# Patient Record
Sex: Female | Born: 1990 | Race: Black or African American | Hispanic: No | Marital: Single | Smoking: Former smoker
Health system: Southern US, Community
[De-identification: ages and names within clinical notes are randomized; demographics above are authoritative.]

## PROBLEM LIST (undated history)

## (undated) ENCOUNTER — Inpatient Hospital Stay (HOSPITAL_COMMUNITY): Payer: Self-pay

## (undated) DIAGNOSIS — R51 Headache: Secondary | ICD-10-CM

## (undated) DIAGNOSIS — D649 Anemia, unspecified: Secondary | ICD-10-CM

## (undated) DIAGNOSIS — B999 Unspecified infectious disease: Secondary | ICD-10-CM

## (undated) DIAGNOSIS — R519 Headache, unspecified: Secondary | ICD-10-CM

## (undated) HISTORY — PX: NO PAST SURGERIES: SHX2092

---

## 2016-01-17 ENCOUNTER — Inpatient Hospital Stay (HOSPITAL_COMMUNITY): Payer: Medicaid Other

## 2016-01-17 ENCOUNTER — Encounter (HOSPITAL_COMMUNITY): Payer: Self-pay | Admitting: *Deleted

## 2016-01-17 ENCOUNTER — Inpatient Hospital Stay (HOSPITAL_COMMUNITY)
Admission: AD | Admit: 2016-01-17 | Discharge: 2016-01-17 | Disposition: A | Discharge status: Home / Self Care | Payer: Medicaid Other | Source: Ambulatory Visit | Attending: Obstetrics and Gynecology | Admitting: Obstetrics and Gynecology

## 2016-01-17 DIAGNOSIS — R109 Unspecified abdominal pain: Secondary | ICD-10-CM | POA: Insufficient documentation

## 2016-01-17 DIAGNOSIS — Z3A01 Less than 8 weeks gestation of pregnancy: Secondary | ICD-10-CM | POA: Diagnosis not present

## 2016-01-17 DIAGNOSIS — O9989 Other specified diseases and conditions complicating pregnancy, childbirth and the puerperium: Secondary | ICD-10-CM

## 2016-01-17 DIAGNOSIS — O26899 Other specified pregnancy related conditions, unspecified trimester: Secondary | ICD-10-CM

## 2016-01-17 DIAGNOSIS — O26891 Other specified pregnancy related conditions, first trimester: Secondary | ICD-10-CM | POA: Diagnosis not present

## 2016-01-17 DIAGNOSIS — Z87891 Personal history of nicotine dependence: Secondary | ICD-10-CM | POA: Insufficient documentation

## 2016-01-17 DIAGNOSIS — N898 Other specified noninflammatory disorders of vagina: Secondary | ICD-10-CM | POA: Diagnosis present

## 2016-01-17 HISTORY — DX: Headache: R51

## 2016-01-17 HISTORY — DX: Headache, unspecified: R51.9

## 2016-01-17 LAB — CBC
HCT: 35.8 % — ABNORMAL LOW (ref 36.0–46.0)
Hemoglobin: 11.7 g/dL — ABNORMAL LOW (ref 12.0–15.0)
MCH: 25.8 pg — ABNORMAL LOW (ref 26.0–34.0)
MCHC: 32.7 g/dL (ref 30.0–36.0)
MCV: 79 fL (ref 78.0–100.0)
Platelets: 385 10*3/uL (ref 150–400)
RBC: 4.53 MIL/uL (ref 3.87–5.11)
RDW: 17.6 % — ABNORMAL HIGH (ref 11.5–15.5)
WBC: 8.8 10*3/uL (ref 4.0–10.5)

## 2016-01-17 LAB — URINE MICROSCOPIC-ADD ON

## 2016-01-17 LAB — WET PREP, GENITAL
Sperm: NONE SEEN
Trich, Wet Prep: NONE SEEN
Yeast Wet Prep HPF POC: NONE SEEN

## 2016-01-17 LAB — URINALYSIS, ROUTINE W REFLEX MICROSCOPIC
Bilirubin Urine: NEGATIVE
Glucose, UA: NEGATIVE mg/dL
Ketones, ur: NEGATIVE mg/dL
Leukocytes, UA: NEGATIVE
Nitrite: NEGATIVE
Protein, ur: NEGATIVE mg/dL
Specific Gravity, Urine: 1.01 (ref 1.005–1.030)
pH: 6 (ref 5.0–8.0)

## 2016-01-17 LAB — HCG, QUANTITATIVE, PREGNANCY: hCG, Beta Chain, Quant, S: 10200 m[IU]/mL — ABNORMAL HIGH (ref <5)

## 2016-01-17 LAB — ABO/RH: ABO/RH(D): O POS

## 2016-01-17 LAB — POCT PREGNANCY, URINE: Preg Test, Ur: POSITIVE — AB

## 2016-01-17 MED ORDER — METRONIDAZOLE 500 MG PO TABS: 500.0000 mg | ORAL_TABLET | Freq: Two times a day (BID) | ORAL | Status: DC

## 2016-01-17 MED ORDER — PRENATAL VITAMINS 28-0.8 MG PO TABS
1.0000 | ORAL_TABLET | ORAL | Status: DC
Start: 2016-01-17 — End: 2017-03-06

## 2016-01-17 NOTE — MAU Note (Addendum)
C/o vaginal discharge for past month; c/o abdominal cramping for past week and has gotten progressively worse; denies any bleeding; G1;

## 2016-01-17 NOTE — Discharge Instructions (Signed)
Prenatal Care Providers °[facility]    [facility] ° Phone- [phone]     Phone: [phone] °         °[facility]                      [facility] ° @[place]     Phone: [phone] ° Phone: [phone] °        [facility] °[facility]     Phone: [phone] ° Phone: [phone]   °        [facility] °[facility] [place]                hone: [phone] ° Phone: [phone] °        [facility] °Dr. Bernard Marshall      Phone: [phone] ° Phone: [phone] °        [facility] °[facility]                Phone: [phone] ° [facility]’s [facility]  ° Phone:[phone]    [facility] ([place]) °         Phone: [phone] °[facility] °  Phone: [phone] °Safe Medications in Pregnancy  ° °Acne: °Benzoyl Peroxide °Salicylic Acid ° °Backache/Headache: °Tylenol: 2 regular strength every 4 hours OR °             2 Extra strength every 6 hours ° °Colds/Coughs/Allergies: °Benadryl (alcohol free) 25 mg every 6 hours as needed °Breath right strips °Claritin °Cepacol throat lozenges °Chloraseptic throat spray °Cold-Eeze- up to three times per day °Cough drops, alcohol free °Flonase (by prescription only) °Guaifenesin °Mucinex °Robitussin DM (plain only, alcohol free) °Saline nasal spray/drops °Sudafed (pseudoephedrine) & Actifed ** use only after [redacted] weeks gestation and if you do not have high blood pressure °Tylenol °Vicks Vaporub °Zinc lozenges °Zyrtec  ° °Constipation: °Colace °Ducolax suppositories °Fleet enema °Glycerin suppositories °Metamucil °Milk of magnesia °Miralax °Senokot °Smooth move tea ° °Diarrhea: °Kaopectate °Imodium A-D ° °*NO pepto Bismol ° °Hemorrhoids: °Anusol °Anusol HC °Preparation H °Tucks ° °Indigestion: °Tums °Maalox °Mylanta °Zantac  °Pepcid ° °Insomnia: °Benadryl (alcohol free) 25mg every 6 hours as needed °Tylenol  PM °Unisom, no Gelcaps ° °Leg Cramps: °Tums °MagGel ° °Nausea/Vomiting:  °Bonine °Dramamine °Emetrol °Ginger extract °Sea bands °Meclizine  °Nausea medication to take during pregnancy:  °Unisom (doxylamine succinate 25 mg tablets) Take one tablet daily at bedtime. If symptoms are not adequately controlled, the dose can be increased to a maximum recommended dose of two tablets daily (1/2 tablet in the morning, 1/2 tablet mid-afternoon and one at bedtime). °Vitamin B6 100mg tablets. Take one tablet twice a day (up to 200 mg per day). ° °Skin Rashes: °Aveeno products °Benadryl cream or 25mg every 6 hours as needed °Calamine Lotion °1% cortisone cream ° °Yeast infection: °Gyne-lotrimin 7 °Monistat 7 ° ° °**If taking multiple medications, please check labels to avoid duplicating the same active ingredients °**take medication as directed on the label °** Do not exceed 4000 mg of tylenol in 24 hours °**Do not take medications that contain aspirin or ibuprofen ° ° ° ° °First Trimester of Pregnancy °The first trimester of pregnancy is from week 1 until the end of week 12 (months 1 through 3). A week after a sperm fertilizes an egg, the egg will implant on the wall of the uterus. This   embryo will begin to develop into a baby. Genes from you and your partner are forming the baby. The female genes determine whether the baby is a boy or a girl. At 6-8 weeks, the eyes and face are formed, and the heartbeat can be seen on ultrasound. At the end of 12 weeks, all the baby's organs are formed.  °Now that you are pregnant, you will want to do everything you can to have a healthy baby. Two of the most important things are to get good prenatal care and to follow your health care provider's instructions. Prenatal care is all the medical care you receive before the baby's birth. This care will help prevent, find, and treat any problems during the pregnancy and childbirth. °BODY CHANGES °Your body goes through many changes during pregnancy.  The changes vary from woman to woman.  °· You may gain or lose a couple of pounds at first. °· You may feel sick to your stomach (nauseous) and throw up (vomit). If the vomiting is uncontrollable, call your health care provider. °· You may tire easily. °· You may develop headaches that can be relieved by medicines approved by your health care provider. °· You may urinate more often. Painful urination may mean you have a bladder infection. °· You may develop heartburn as a result of your pregnancy. °· You may develop constipation because certain hormones are causing the muscles that push waste through your intestines to slow down. °· You may develop hemorrhoids or swollen, bulging veins (varicose veins). °· Your breasts may begin to grow larger and become tender. Your nipples may stick out more, and the tissue that surrounds them (areola) may become darker. °· Your gums may bleed and may be sensitive to brushing and flossing. °· Dark spots or blotches (chloasma, mask of pregnancy) may develop on your face. This will likely fade after the baby is born. °· Your menstrual periods will stop. °· You may have a loss of appetite. °· You may develop cravings for certain kinds of food. °· You may have changes in your emotions from day to day, such as being excited to be pregnant or being concerned that something may go wrong with the pregnancy and baby. °· You may have more vivid and strange dreams. °· You may have changes in your hair. These can include thickening of your hair, rapid growth, and changes in texture. Some women also have hair loss during or after pregnancy, or hair that feels dry or thin. Your hair will most likely return to normal after your baby is born. °WHAT TO EXPECT AT YOUR PRENATAL VISITS °During a routine prenatal visit: °· You will be weighed to make sure you and the baby are growing normally. °· Your blood pressure will be taken. °· Your abdomen will be measured to track your baby's growth. °· The  fetal heartbeat will be listened to starting around week 10 or 12 of your pregnancy. °· Test results from any previous visits will be discussed. °Your health care provider may ask you: °· How you are feeling. °· If you are feeling the baby move. °· If you have had any abnormal symptoms, such as leaking fluid, bleeding, severe headaches, or abdominal cramping. °· If you are using any tobacco products, including cigarettes, chewing tobacco, and electronic cigarettes. °· If you have any questions. °Other tests that may be performed during your first trimester include: °· Blood tests to find your blood type and to check for the presence of any previous   infections. They will also be used to check for low iron levels (anemia) and Rh antibodies. Later in the pregnancy, blood tests for diabetes will be done along with other tests if problems develop. °· Urine tests to check for infections, diabetes, or protein in the urine. °· An ultrasound to confirm the proper growth and development of the baby. °· An amniocentesis to check for possible genetic problems. °· Fetal screens for spina bifida and Down syndrome. °· You may need other tests to make sure you and the baby are doing well. °· HIV (human immunodeficiency virus) testing. Routine prenatal testing includes screening for HIV, unless you choose not to have this test. °HOME CARE INSTRUCTIONS  °Medicines °· Follow your health care provider's instructions regarding medicine use. Specific medicines may be either safe or unsafe to take during pregnancy. °· Take your prenatal vitamins as directed. °· If you develop constipation, try taking a stool softener if your health care provider approves. °Diet °· Eat regular, well-balanced meals. Choose a variety of foods, such as meat or vegetable-based protein, fish, milk and low-fat dairy products, vegetables, fruits, and whole grain breads and cereals. Your health care provider will help you determine the amount of weight gain that  is right for you. °· Avoid raw meat and uncooked cheese. These carry germs that can cause birth defects in the baby. °· Eating four or five small meals rather than three large meals a day may help relieve nausea and vomiting. If you start to feel nauseous, eating a few soda crackers can be helpful. Drinking liquids between meals instead of during meals also seems to help nausea and vomiting. °· If you develop constipation, eat more high-fiber foods, such as fresh vegetables or fruit and whole grains. Drink enough fluids to keep your urine clear or pale yellow. °Activity and Exercise °· Exercise only as directed by your health care provider. Exercising will help you: °¨ Control your weight. °¨ Stay in shape. °¨ Be prepared for labor and delivery. °· Experiencing pain or cramping in the lower abdomen or low back is a good sign that you should stop exercising. Check with your health care provider before continuing normal exercises. °· Try to avoid standing for long periods of time. Move your legs often if you must stand in one place for a long time. °· Avoid heavy lifting. °· Wear low-heeled shoes, and practice good posture. °· You may continue to have sex unless your health care provider directs you otherwise. °Relief of Pain or Discomfort °· Wear a good support bra for breast tenderness.   °· Take warm sitz baths to soothe any pain or discomfort caused by hemorrhoids. Use hemorrhoid cream if your health care provider approves.   °· Rest with your legs elevated if you have leg cramps or low back pain. °· If you develop varicose veins in your legs, wear support hose. Elevate your feet for 15 minutes, 3-4 times a day. Limit salt in your diet. °Prenatal Care °· Schedule your prenatal visits by the twelfth week of pregnancy. They are usually scheduled monthly at first, then more often in the last 2 months before delivery. °· Write down your questions. Take them to your prenatal visits. °· Keep all your prenatal visits as  directed by your health care provider. °Safety °· Wear your seat belt at all times when driving. °· Make a list of emergency phone numbers, including numbers for family, friends, the hospital, and police and fire departments. °General Tips °· Ask your health care   provider for a referral to a local prenatal education class. Begin classes no later than at the beginning of month 6 of your pregnancy. °· Ask for help if you have counseling or nutritional needs during pregnancy. Your health care provider can offer advice or refer you to specialists for help with various needs. °· Do not use hot tubs, steam rooms, or saunas. °· Do not douche or use tampons or scented sanitary pads. °· Do not cross your legs for long periods of time. °· Avoid cat litter boxes and soil used by cats. These carry germs that can cause birth defects in the baby and possibly loss of the fetus by miscarriage or stillbirth. °· Avoid all smoking, herbs, alcohol, and medicines not prescribed by your health care provider. Chemicals in these affect the formation and growth of the baby. °· Do not use any tobacco products, including cigarettes, chewing tobacco, and electronic cigarettes. If you need help quitting, ask your health care provider. You may receive counseling support and other resources to help you quit. °· Schedule a dentist appointment. At home, brush your teeth with a soft toothbrush and be gentle when you floss. °SEEK MEDICAL CARE IF:  °· You have dizziness. °· You have mild pelvic cramps, pelvic pressure, or nagging pain in the abdominal area. °· You have persistent nausea, vomiting, or diarrhea. °· You have a bad smelling vaginal discharge. °· You have pain with urination. °· You notice increased swelling in your face, hands, legs, or ankles. °SEEK IMMEDIATE MEDICAL CARE IF:  °· You have a fever. °· You are leaking fluid from your vagina. °· You have spotting or bleeding from your vagina. °· You have severe abdominal cramping or  pain. °· You have rapid weight gain or loss. °· You vomit blood or material that looks like coffee grounds. °· You are exposed to German measles and have never had them. °· You are exposed to fifth disease or chickenpox. °· You develop a severe headache. °· You have shortness of breath. °· You have any kind of trauma, such as from a fall or a car accident. °  °This information is not intended to replace advice given to you by your health care provider. Make sure you discuss any questions you have with your health care provider. °  °Document Released: 12/08/2001 Document Revised: 01/04/2015 Document Reviewed: 10/24/2013 °Elsevier Interactive Patient Education ©2016 [employer] ° °

## 2016-01-17 NOTE — MAU Provider Note (Signed)
History     CSN: [id number]  Arrival date and time: 01/17/16 1044   First Provider Initiated Contact with Patient 01/17/16 1121      Chief Complaint  Patient presents with  . Vaginal Discharge  . Abdominal Pain   HPI  Cynthia Hull 25 y.o. G1P0  presents with c/o of lower abdominal cramping, vaginal discharge . Denies vaginal bleeding. No prior ultrasound done.  Past Medical History  Diagnosis Date  . Headache     Past Surgical History  Procedure Laterality Date  . No past surgeries      Family History  Problem Relation Age of Onset  . Hypertension Mother     Social History  Substance Use Topics  . Smoking status: Former Smoker -- 1 years  . Smokeless tobacco: None  . Alcohol Use: No    Allergies: No Known Allergies  Prescriptions prior to admission  Medication Sig Dispense Refill Last Dose  . acetaminophen (TYLENOL) 500 MG tablet Take 1,000 mg by mouth every 6 (six) hours as needed for mild pain or headache.   01/16/2016 at Unknown time  . ibuprofen (ADVIL,MOTRIN) 200 MG tablet Take 200 mg by mouth every 6 (six) hours as needed for headache.   Past Week at Unknown time    Review of Systems  Constitutional: Negative for fever.  Gastrointestinal: Positive for abdominal pain.  Genitourinary:       Vaginal discharge  All other systems reviewed and are negative.  Physical Exam   Blood pressure 122/68, pulse 93, temperature 98.2 F (36.8 C), temperature source Oral, resp. rate 18, height  (1.626 m), weight 65.772 kg (145 lb), last menstrual period 12/11/2015.  Physical Exam  Nursing note and vitals reviewed. Constitutional: She is oriented to person, place, and time. She appears well-developed and well-nourished. No distress.  HENT:  Head: Normocephalic and atraumatic.  Cardiovascular: Normal rate.   Respiratory: Effort normal. No respiratory distress.  GI: Soft. She exhibits no distension and no mass. There is no tenderness. There is no rebound  and no guarding.  Genitourinary: Vagina normal.  Neurological: She is alert and oriented to person, place, and time.  Skin: Skin is warm and dry.  Psychiatric: She has a normal mood and affect. Her behavior is normal. Judgment and thought content normal.   Results for orders placed or performed during the hospital encounter of 01/17/16 (from the past 24 hour(s))  Urinalysis, Routine w reflex microscopic (not at [facility])     Status: Abnormal   Collection Time: 01/17/16 11:05 AM  Result Value Ref Range   Color, Urine YELLOW YELLOW   APPearance CLEAR CLEAR   Specific Gravity, Urine 1.010 1.005 - 1.030   pH 6.0 5.0 - 8.0   Glucose, UA NEGATIVE NEGATIVE mg/dL   Hgb urine dipstick TRACE (A) NEGATIVE   Bilirubin Urine NEGATIVE NEGATIVE   Ketones, ur NEGATIVE NEGATIVE mg/dL   Protein, ur NEGATIVE NEGATIVE mg/dL   Nitrite NEGATIVE NEGATIVE   Leukocytes, UA NEGATIVE NEGATIVE  Urine microscopic-add on     Status: Abnormal   Collection Time: 01/17/16 11:05 AM  Result Value Ref Range   Squamous Epithelial / LPF 0-5 (A) NONE SEEN   WBC, UA 0-5 0 - 5 WBC/hpf   RBC / HPF 0-5 0 - 5 RBC/hpf   Bacteria, UA RARE (A) NONE SEEN  Pregnancy, urine POC     Status: Abnormal   Collection Time: 01/17/16 11:07 AM  Result Value Ref Range   Preg Test, Ur POSITIVE (  A) NEGATIVE  CBC     Status: Abnormal   Collection Time: 01/17/16 11:30 AM  Result Value Ref Range   WBC 8.8 4.0 - 10.5 K/uL   RBC 4.53 3.87 - 5.11 MIL/uL   Hemoglobin 11.7 (L) 12.0 - 15.0 g/dL   HCT 19.1 (L) 47.8 - 29.5 %   MCV 79.0 78.0 - 100.0 fL   MCH 25.8 (L) 26.0 - 34.0 pg   MCHC 32.7 30.0 - 36.0 g/dL   RDW 62.1 (H) 30.8 - 65.7 %   Platelets 385 150 - 400 K/uL  hCG, quantitative, pregnancy     Status: Abnormal   Collection Time: 01/17/16 11:30 AM  Result Value Ref Range   hCG, Beta Chain, Quant, S 10200 (H) <5 mIU/mL  Wet prep, genital     Status: Abnormal   Collection Time: 01/17/16 11:37 AM  Result Value Ref Range   Yeast Wet Prep  HPF POC NONE SEEN NONE SEEN   Trich, Wet Prep NONE SEEN NONE SEEN   Clue Cells Wet Prep HPF POC PRESENT (A) NONE SEEN   WBC, Wet Prep HPF POC MODERATE (A) NONE SEEN   Sperm NONE SEEN     MAU Course  Procedures  MDM Ectopic workup- pending labs; Positive Gestional Sac and Yolk sac identified on Ultrasound. Follow up with routine prenatal care  Assessment and Plan   Abdominal cramping in pregnancy Start prenatal vitamins Start prenatal care discharge Kristiane Morsch Grissett 01/17/2016, 11:56 AM

## 2016-01-23 ENCOUNTER — Inpatient Hospital Stay (HOSPITAL_COMMUNITY)
Admission: AD | Admit: 2016-01-23 | Discharge: 2016-01-23 | Disposition: A | Discharge status: Home / Self Care | Payer: Medicaid Other | Source: Ambulatory Visit | Attending: Obstetrics & Gynecology | Admitting: Obstetrics & Gynecology

## 2016-01-23 DIAGNOSIS — Z87891 Personal history of nicotine dependence: Secondary | ICD-10-CM | POA: Insufficient documentation

## 2016-01-23 DIAGNOSIS — Z3A01 Less than 8 weeks gestation of pregnancy: Secondary | ICD-10-CM | POA: Diagnosis not present

## 2016-01-23 DIAGNOSIS — R109 Unspecified abdominal pain: Secondary | ICD-10-CM | POA: Diagnosis not present

## 2016-01-23 DIAGNOSIS — O9989 Other specified diseases and conditions complicating pregnancy, childbirth and the puerperium: Secondary | ICD-10-CM | POA: Diagnosis not present

## 2016-01-23 DIAGNOSIS — O26899 Other specified pregnancy related conditions, unspecified trimester: Secondary | ICD-10-CM

## 2016-01-23 DIAGNOSIS — O219 Vomiting of pregnancy, unspecified: Secondary | ICD-10-CM

## 2016-01-23 DIAGNOSIS — O210 Mild hyperemesis gravidarum: Secondary | ICD-10-CM | POA: Insufficient documentation

## 2016-01-23 LAB — URINE MICROSCOPIC-ADD ON: RBC / HPF: NONE SEEN RBC/hpf (ref 0–5)

## 2016-01-23 LAB — URINALYSIS, ROUTINE W REFLEX MICROSCOPIC
Bilirubin Urine: NEGATIVE
Glucose, UA: NEGATIVE mg/dL
Ketones, ur: NEGATIVE mg/dL
Leukocytes, UA: NEGATIVE
Nitrite: NEGATIVE
Protein, ur: NEGATIVE mg/dL
Specific Gravity, Urine: 1.025 (ref 1.005–1.030)
pH: 7 (ref 5.0–8.0)

## 2016-01-23 MED ORDER — PROMETHAZINE HCL 25 MG PO TABS
25.0000 mg | ORAL_TABLET | Freq: Once | ORAL | Status: AC
  Administered 2016-01-23: 25 mg via ORAL
  Filled 2016-01-23: qty 1

## 2016-01-23 MED ORDER — PROMETHAZINE HCL 25 MG PO TABS: 12.5000 mg | ORAL_TABLET | Freq: Four times a day (QID) | ORAL | Status: DC | PRN

## 2016-01-23 NOTE — Discharge Instructions (Signed)

## 2016-01-23 NOTE — MAU Provider Note (Signed)
  History     CSN: [id number]  Arrival date and time: 01/23/16 2116   First Provider Initiated Contact with Patient 01/23/16 2246      Chief Complaint  Patient presents with  . Abdominal Cramping   Diarrhea  This is a new problem. The current episode started today. The problem occurs 2 to 4 times per day. The problem has been unchanged. Diarrhea characteristics: loose stools  Associated symptoms include abdominal pain. Pertinent negatives include no chills, fever or vomiting. Nothing aggravates the symptoms. There are no known risk factors. She has tried nothing for the symptoms.  Emesis  This is a new problem. The current episode started in the past 7 days. Episode frequency: No vomiting only nausea  The problem has been unchanged. There has been no fever. Associated symptoms include abdominal pain and diarrhea. Pertinent negatives include no chills or fever. Risk factors: pregnancy  She has tried nothing for the symptoms.     Past Medical History  Diagnosis Date  . Headache     Past Surgical History  Procedure Laterality Date  . No past surgeries      Family History  Problem Relation Age of Onset  . Hypertension Mother     Social History  Substance Use Topics  . Smoking status: Former Smoker -- 1 years  . Smokeless tobacco: Not on file  . Alcohol Use: No    Allergies: No Known Allergies  Prescriptions prior to admission  Medication Sig Dispense Refill Last Dose  . acetaminophen (TYLENOL) 500 MG tablet Take 1,000 mg by mouth every 6 (six) hours as needed for mild pain or headache.   01/22/2016 at Unknown time  . Prenatal Vit-Fe Fumarate-FA (PRENATAL VITAMINS) 28-0.8 MG TABS Take 1 capsule by mouth 1 day or 1 dose. 90 tablet 3 01/23/2016 at Unknown time  . metroNIDAZOLE (FLAGYL) 500 MG tablet Take 1 tablet (500 mg total) by mouth 2 (two) times daily. 14 tablet 0 Has not started    Review of Systems  Constitutional: Negative for fever and chills.  Gastrointestinal:  Positive for nausea, abdominal pain and diarrhea. Negative for vomiting and constipation.  Genitourinary: Negative for dysuria, urgency and frequency.   Physical Exam   Blood pressure 113/52, pulse 86, temperature 97.7 F (36.5 C), temperature source Oral, resp. rate 18, height  (1.549 m), weight 66.792 kg (147 lb 4 oz), last menstrual period 12/11/2015.  Physical Exam  Nursing note and vitals reviewed. Constitutional: She is oriented to person, place, and time. She appears well-developed and well-nourished. No distress.  HENT:  Head: Normocephalic.  Cardiovascular: Normal rate.   Respiratory: Effort normal.  GI: Soft. There is no tenderness. There is no rebound.  Neurological: She is alert and oriented to person, place, and time.  Skin: Skin is warm and dry.  Psychiatric: She has a normal mood and affect.    MAU Course  Procedures  MDM Patient has had phenergan. She reports feeling better.  Assessment and Plan   1. Nausea/vomiting in pregnancy   2. Abdominal pain affecting pregnancy    DC home Comfort measures reviewed  2nd Trimester precautions  RX: phenergan  Return to MAU as needed FU with OB as planned  Follow-up Information    Follow up with [facility].   Why:  As scheduled   Contact information:   [address] [place] [address] [phone]         Tawnya Crook 01/23/2016, 10:54 PM

## 2016-01-23 NOTE — MAU Note (Signed)
Diarrhea all day. Nausea throughout the pregnancy, and is unable to eat much. C/o cramping with diarrhea today as well.

## 2016-01-23 NOTE — MAU Note (Signed)
PT SAYS SHE WAS HERE ON 1-20-      NOW FEELS  LIGHT  CRAMPING.   FEELS  NAUSEA - NO VOMITING       TODAY  HAD    DIARRHEA  -  WATERY  ALL DAY.       PLANS  TO GET [facility]  WITH  ?  - APPOINTMENT  ON 2-16.

## 2016-02-23 ENCOUNTER — Inpatient Hospital Stay (HOSPITAL_COMMUNITY)
Admission: AD | Admit: 2016-02-23 | Discharge: 2016-02-23 | Disposition: A | Discharge status: Home / Self Care | Payer: Medicaid Other | Source: Ambulatory Visit | Attending: Obstetrics & Gynecology | Admitting: Obstetrics & Gynecology

## 2016-02-23 ENCOUNTER — Encounter (HOSPITAL_COMMUNITY): Payer: Self-pay | Admitting: *Deleted

## 2016-02-23 DIAGNOSIS — O26891 Other specified pregnancy related conditions, first trimester: Secondary | ICD-10-CM | POA: Insufficient documentation

## 2016-02-23 DIAGNOSIS — Z3A10 10 weeks gestation of pregnancy: Secondary | ICD-10-CM | POA: Insufficient documentation

## 2016-02-23 DIAGNOSIS — R112 Nausea with vomiting, unspecified: Secondary | ICD-10-CM | POA: Insufficient documentation

## 2016-02-23 DIAGNOSIS — Z79899 Other long term (current) drug therapy: Secondary | ICD-10-CM | POA: Insufficient documentation

## 2016-02-23 DIAGNOSIS — O219 Vomiting of pregnancy, unspecified: Secondary | ICD-10-CM | POA: Diagnosis not present

## 2016-02-23 LAB — URINALYSIS, ROUTINE W REFLEX MICROSCOPIC
Bilirubin Urine: NEGATIVE
Glucose, UA: NEGATIVE mg/dL
Ketones, ur: 40 mg/dL — AB
Nitrite: NEGATIVE
Protein, ur: NEGATIVE mg/dL
Specific Gravity, Urine: 1.025 (ref 1.005–1.030)
pH: 6 (ref 5.0–8.0)

## 2016-02-23 LAB — COMPREHENSIVE METABOLIC PANEL
ALT: 21 U/L (ref 14–54)
AST: 21 U/L (ref 15–41)
Albumin: 3.8 g/dL (ref 3.5–5.0)
Alkaline Phosphatase: 45 U/L (ref 38–126)
Anion gap: 8 (ref 5–15)
BUN: 8 mg/dL (ref 6–20)
CO2: 21 mmol/L — ABNORMAL LOW (ref 22–32)
Calcium: 10.4 mg/dL — ABNORMAL HIGH (ref 8.9–10.3)
Chloride: 102 mmol/L (ref 101–111)
Creatinine, Ser: 0.59 mg/dL (ref 0.44–1.00)
GFR calc Af Amer: >60 mL/min (ref >60)
GFR calc non Af Amer: >60 mL/min (ref >60)
Glucose, Bld: 93 mg/dL (ref 65–99)
Potassium: 3.9 mmol/L (ref 3.5–5.1)
Sodium: 131 mmol/L — ABNORMAL LOW (ref 135–145)
Total Bilirubin: 0.6 mg/dL (ref 0.3–1.2)
Total Protein: 7.3 g/dL (ref 6.5–8.1)

## 2016-02-23 LAB — CBC
HCT: 35.5 % — ABNORMAL LOW (ref 36.0–46.0)
Hemoglobin: 12.4 g/dL (ref 12.0–15.0)
MCH: 27.8 pg (ref 26.0–34.0)
MCHC: 34.9 g/dL (ref 30.0–36.0)
MCV: 79.6 fL (ref 78.0–100.0)
Platelets: 333 10*3/uL (ref 150–400)
RBC: 4.46 MIL/uL (ref 3.87–5.11)
RDW: 19.5 % — ABNORMAL HIGH (ref 11.5–15.5)
WBC: 10.6 10*3/uL — ABNORMAL HIGH (ref 4.0–10.5)

## 2016-02-23 LAB — URINE MICROSCOPIC-ADD ON

## 2016-02-23 MED ORDER — DEXTROSE 5 % IN LACTATED RINGERS IV BOLUS
1000.0000 mL | Freq: Once | INTRAVENOUS | Status: AC
  Administered 2016-02-23: 1000 mL via INTRAVENOUS

## 2016-02-23 MED ORDER — METOCLOPRAMIDE HCL 10 MG PO TABS: 10.0000 mg | ORAL_TABLET | Freq: Four times a day (QID) | ORAL | Status: DC

## 2016-02-23 MED ORDER — METOCLOPRAMIDE HCL 5 MG/ML IJ SOLN
10.0000 mg | Freq: Once | INTRAMUSCULAR | Status: AC
  Administered 2016-02-23: 10 mg via INTRAVENOUS
  Filled 2016-02-23: qty 2

## 2016-02-23 NOTE — MAU Note (Signed)
Patient is [redacted] weeks pregnant and has not been able to tolerate anything PO for 2 days.  Pt has tried taking phenergan, but states it hasn't helped.  She last took it around 0030.  No c/o pain or vaginal bleeding.

## 2016-02-23 NOTE — Discharge Instructions (Signed)
Morning Sickness °Morning sickness is when you feel sick to your stomach (nauseous) during pregnancy. This nauseous feeling may or may not come with vomiting. It often occurs in the morning but can be a problem any time of day. Morning sickness is most common during the first trimester, but it may continue throughout pregnancy. While morning sickness is unpleasant, it is usually harmless unless you develop severe and continual vomiting (hyperemesis gravidarum). This condition requires more intense treatment.  °CAUSES  °The cause of morning sickness is not completely known but seems to be related to normal hormonal changes that occur in pregnancy. °RISK FACTORS °You are at greater risk if you: °· Experienced nausea or vomiting before your pregnancy. °· Had morning sickness during a previous pregnancy. °· Are pregnant with more than one baby, such as twins. °TREATMENT  °Do not use any medicines (prescription, over-the-counter, or herbal) for morning sickness without first talking to your health care provider. Your health care provider may prescribe or recommend: °· Vitamin B6 supplements. °· Anti-nausea medicines. °· The herbal medicine ginger. °HOME CARE INSTRUCTIONS  °· Only take over-the-counter or prescription medicines as directed by your health care provider. °· Taking multivitamins before getting pregnant can prevent or decrease the severity of morning sickness in most women. °· Eat a piece of dry toast or unsalted crackers before getting out of bed in the morning. °· Eat five or six small meals a day. °· Eat dry and bland foods (rice, baked potato). Foods high in carbohydrates are often helpful. °· Do not drink liquids with your meals. Drink liquids between meals. °· Avoid greasy, fatty, and spicy foods. °· Get someone to cook for you if the smell of any food causes nausea and vomiting. °· If you feel nauseous after taking prenatal vitamins, take the vitamins at night or with a snack.  °· Snack on protein  foods (nuts, yogurt, cheese) between meals if you are hungry. °· Eat unsweetened gelatins for desserts. °· Wearing an acupressure wristband (worn for sea sickness) may be helpful. °· Acupuncture may be helpful. °· Do not smoke. °· Get a humidifier to keep the air in your house free of odors. °· Get plenty of fresh air. °SEEK MEDICAL CARE IF:  °· Your home remedies are not working, and you need medicine. °· You feel dizzy or lightheaded. °· You are losing weight. °SEEK IMMEDIATE MEDICAL CARE IF:  °· You have persistent and uncontrolled nausea and vomiting. °· You pass out (faint). °MAKE SURE YOU: °· Understand these instructions. °· Will watch your condition. °· Will get help right away if you are not doing well or get worse. °  °This information is not intended to replace advice given to you by your health care provider. Make sure you discuss any questions you have with your health care provider. °  °Document Released: 02/04/2007 Document Revised: 12/19/2013 Document Reviewed: 05/31/2013 °Elsevier Interactive Patient Education ©2016 [employer] ° ° ° °Eating Plan for Hyperemesis Gravidarum °Severe cases of hyperemesis gravidarum can lead to dehydration and malnutrition. The hyperemesis eating plan is one way to lessen the symptoms of nausea and vomiting. It is often used with prescribed medicines to control your symptoms.  °WHAT CAN I DO TO RELIEVE MY SYMPTOMS? °Listen to your body. Everyone is different and has different preferences. Find what works best for you. Some of the following things may help: °· Eat and drink slowly. °· Eat 5-6 small meals daily instead of 3 large meals.   °· Eat crackers before you   get out of bed in the morning.   °· Starchy foods are usually well tolerated (such as cereal, toast, bread, potatoes, pasta, rice, and pretzels).   °· Ginger may help with nausea. Add ¼ tsp ground ginger to hot tea or choose ginger tea.   °· Try drinking 100% fruit juice or an electrolyte drink. °· Continue  to take your prenatal vitamins as directed by your health care provider. If you are having trouble taking your prenatal vitamins, talk with your health care provider about different options. °· Include at least 1 serving of protein with your meals and snacks (such as meats or poultry, beans, nuts, eggs, or yogurt). Try eating a protein-rich snack before bed (such as cheese and crackers or a half turkey or peanut butter sandwich). °WHAT THINGS SHOULD I AVOID TO REDUCE MY SYMPTOMS? °The following things may help reduce your symptoms: °· Avoid foods with strong smells. Try eating meals in well-ventilated areas that are free of odors. °· Avoid drinking water or other beverages with meals. Try not to drink anything less than 30 minutes before and after meals. °· Avoid drinking more than 1 cup of fluid at a time. °· Avoid fried or high-fat foods, such as butter and cream sauces. °· Avoid spicy foods. °· Avoid skipping meals the best you can. Nausea can be more intense on an empty stomach. If you cannot tolerate food at that time, do not force it. Try sucking on ice chips or other frozen items and make up the calories later. °· Avoid lying down within 2 hours after eating. °  °This information is not intended to replace advice given to you by your health care provider. Make sure you discuss any questions you have with your health care provider. °  °Document Released: 10/11/2007 Document Revised: 12/19/2013 Document Reviewed: 10/18/2013 °Elsevier Interactive Patient Education ©2016 [employer] ° °

## 2016-02-23 NOTE — MAU Provider Note (Signed)
History     CSN: [id number]  Arrival date and time: 02/23/16 0707   First Provider Initiated Contact with Patient 02/23/16 [phone]        Chief Complaint  Patient presents with  . Emesis   HPI  Cynthia Hull is a 25 y.o. G1P0 at [redacted]w[redacted]d who presents for nausea/vomiting. Symptoms began yesterday morning. Has vomited 3 times in the last 24 hours; last vomited at 1 am. Reports constant nausea. States hasn't been able to keep down food or liquids since Friday. Tried to eat chicken noodle soup last night but vomited. Has phenergan at home; last took it at midnight.  Getting PNC at [facility] in [place]. Last seen there on Thursday; was able to hear fetal heartbeat with doppler during that visit & is requesting doppler today to make sure baby is ok.  Denies abdominal pain, diarrhea, constipation, fever, vaginal bleeding, urinary complaints, or heartburn.   OB History    Gravida Para Term Preterm AB TAB SAB Ectopic Multiple Living   1               Past Medical History  Diagnosis Date  . Headache     Past Surgical History  Procedure Laterality Date  . No past surgeries      Family History  Problem Relation Age of Onset  . Hypertension Mother     Social History  Substance Use Topics  . Smoking status: Current Some Day Smoker -- 1 years  . Smokeless tobacco: None  . Alcohol Use: No    Allergies: No Known Allergies  Prescriptions prior to admission  Medication Sig Dispense Refill Last Dose  . acetaminophen (TYLENOL) 500 MG tablet Take 1,000 mg by mouth every 6 (six) hours as needed for mild pain or headache.   Past Week at Unknown time  . Prenatal Vit-Fe Fumarate-FA (PRENATAL VITAMINS) 28-0.8 MG TABS Take 1 capsule by mouth 1 day or 1 dose. 90 tablet 3 02/22/2016 at Unknown time  . promethazine (PHENERGAN) 25 MG tablet Take 0.5-1 tablets (12.5-25 mg total) by mouth every 6 (six) hours as needed. 30 tablet 0 02/23/2016 at Unknown time  . metroNIDAZOLE (FLAGYL) 500 MG  tablet Take 1 tablet (500 mg total) by mouth 2 (two) times daily. (Patient not taking: Reported on 02/23/2016) 14 tablet 0 Has not started    Review of Systems  Constitutional: Negative.   HENT: Negative.   Gastrointestinal: Positive for nausea and vomiting. Negative for heartburn, abdominal pain, diarrhea and constipation.  Genitourinary: Negative.   Neurological: Negative for dizziness.   Physical Exam   Blood pressure 120/66, pulse 85, temperature 97.8 F (36.6 C), temperature source Oral, resp. rate 18, height 5\' 3"  (1.6 m), weight 142 lb (64.411 kg), last menstrual period 12/11/2015, SpO2 100 %.  Physical Exam  Nursing note and vitals reviewed. Constitutional: She is oriented to person, place, and time. She appears well-developed and well-nourished. No distress.  HENT:  Head: Normocephalic and atraumatic.  Eyes: Conjunctivae are normal. Right eye exhibits no discharge. Left eye exhibits no discharge. No scleral icterus.  Neck: Normal range of motion.  Cardiovascular: Normal rate, regular rhythm and normal heart sounds.   No murmur heard. Respiratory: Effort normal and breath sounds normal. No respiratory distress. She has no wheezes.  GI: Soft. Bowel sounds are normal. She exhibits no distension. There is no tenderness.  Neurological: She is alert and oriented to person, place, and time.  Skin: Skin is warm and dry. She is not  diaphoretic.  Psychiatric: She has a normal mood and affect. Her behavior is normal. Judgment and thought content normal.    MAU Course  Procedures  MDM While awaiting u/a, will give IV fluids per patient request D5LR bolus & reglan 10 mg IV CBC & CMP  Care turned over to [facility] CNM     Judeth Horn, NP 02/23/2016 8:12 AM  Assessment and Plan   Results for orders placed or performed during the hospital encounter of 02/23/16 (from the past 24 hour(s))  Urinalysis, Routine w reflex microscopic (not at [facility])     Status: Abnormal    Collection Time: 02/23/16  7:32 AM  Result Value Ref Range   Color, Urine YELLOW YELLOW   APPearance HAZY (A) CLEAR   Specific Gravity, Urine 1.025 1.005 - 1.030   pH 6.0 5.0 - 8.0   Glucose, UA NEGATIVE NEGATIVE mg/dL   Hgb urine dipstick TRACE (A) NEGATIVE   Bilirubin Urine NEGATIVE NEGATIVE   Ketones, ur 40 (A) NEGATIVE mg/dL   Protein, ur NEGATIVE NEGATIVE mg/dL   Nitrite NEGATIVE NEGATIVE   Leukocytes, UA SMALL (A) NEGATIVE  Urine microscopic-add on     Status: Abnormal   Collection Time: 02/23/16  7:32 AM  Result Value Ref Range   Squamous Epithelial / LPF 6-30 (A) NONE SEEN   WBC, UA 0-5 0 - 5 WBC/hpf   RBC / HPF 0-5 0 - 5 RBC/hpf   Bacteria, UA FEW (A) NONE SEEN  CBC     Status: Abnormal   Collection Time: 02/23/16  8:00 AM  Result Value Ref Range   WBC 10.6 (H) 4.0 - 10.5 K/uL   RBC 4.46 3.87 - 5.11 MIL/uL   Hemoglobin 12.4 12.0 - 15.0 g/dL   HCT 16.1 (L) 09.6 - 04.5 %   MCV 79.6 78.0 - 100.0 fL   MCH 27.8 26.0 - 34.0 pg   MCHC 34.9 30.0 - 36.0 g/dL   RDW 40.9 (H) 81.1 - 91.4 %   Platelets 333 150 - 400 K/uL  Comprehensive metabolic panel     Status: Abnormal   Collection Time: 02/23/16  8:00 AM  Result Value Ref Range   Sodium 131 (L) 135 - 145 mmol/L   Potassium 3.9 3.5 - 5.1 mmol/L   Chloride 102 101 - 111 mmol/L   CO2 21 (L) 22 - 32 mmol/L   Glucose, Bld 93 65 - 99 mg/dL   BUN 8 6 - 20 mg/dL   Creatinine, Ser 7.82 0.44 - 1.00 mg/dL   Calcium 95.6 (H) 8.9 - 10.3 mg/dL   Total Protein 7.3 6.5 - 8.1 g/dL   Albumin 3.8 3.5 - 5.0 g/dL   AST 21 15 - 41 U/L   ALT 21 14 - 54 U/L   Alkaline Phosphatase 45 38 - 126 U/L   Total Bilirubin 0.6 0.3 - 1.2 mg/dL   GFR calc non Af Amer >60 >60 mL/min   GFR calc Af Amer >60 >60 mL/min   Anion gap 8 5 - 15    MDM: Reviewed lab results.  Pt is mildly dehydrated on UA today, electrolytes wnl.  D5LR x 1000 ml and Reglan 10 mg IV given with complete resolution of nausea. Pt tolerated PO fluids/food while in MAU.  D/C home  with Rx for Reglan.  Pt stable at time of discharge.  1. Nausea and vomiting during pregnancy prior to [redacted] weeks gestation     D/C home Reglan 10 mg PO Q 6 hours  PRN, discontinue Phenergan or only use at night Resume Diclegis as prescribed F/U with prenatal care provider Return to MAU as needed for emergenies  LEFTWICH-KIRBY, LISA, CNM 9:02 AM

## 2016-03-30 DIAGNOSIS — R87612 Low grade squamous intraepithelial lesion on cytologic smear of cervix (LGSIL): Secondary | ICD-10-CM | POA: Insufficient documentation

## 2016-11-21 ENCOUNTER — Encounter (HOSPITAL_COMMUNITY): Payer: Self-pay

## 2017-03-06 ENCOUNTER — Inpatient Hospital Stay (HOSPITAL_COMMUNITY): Payer: Medicaid Other

## 2017-03-06 ENCOUNTER — Encounter (HOSPITAL_COMMUNITY): Payer: Self-pay | Admitting: Obstetrics and Gynecology

## 2017-03-06 ENCOUNTER — Inpatient Hospital Stay (HOSPITAL_COMMUNITY)
Admission: AD | Admit: 2017-03-06 | Discharge: 2017-03-06 | Disposition: A | Discharge status: Home / Self Care | Payer: Medicaid Other | Source: Ambulatory Visit | Attending: Obstetrics & Gynecology | Admitting: Obstetrics & Gynecology

## 2017-03-06 DIAGNOSIS — F17200 Nicotine dependence, unspecified, uncomplicated: Secondary | ICD-10-CM | POA: Diagnosis not present

## 2017-03-06 DIAGNOSIS — N760 Acute vaginitis: Secondary | ICD-10-CM | POA: Diagnosis not present

## 2017-03-06 DIAGNOSIS — B9689 Other specified bacterial agents as the cause of diseases classified elsewhere: Secondary | ICD-10-CM | POA: Diagnosis not present

## 2017-03-06 DIAGNOSIS — O23591 Infection of other part of genital tract in pregnancy, first trimester: Secondary | ICD-10-CM | POA: Insufficient documentation

## 2017-03-06 DIAGNOSIS — Z3A01 Less than 8 weeks gestation of pregnancy: Secondary | ICD-10-CM | POA: Insufficient documentation

## 2017-03-06 DIAGNOSIS — R109 Unspecified abdominal pain: Secondary | ICD-10-CM | POA: Insufficient documentation

## 2017-03-06 DIAGNOSIS — O26899 Other specified pregnancy related conditions, unspecified trimester: Secondary | ICD-10-CM

## 2017-03-06 DIAGNOSIS — O99332 Smoking (tobacco) complicating pregnancy, second trimester: Secondary | ICD-10-CM | POA: Insufficient documentation

## 2017-03-06 LAB — URINALYSIS, ROUTINE W REFLEX MICROSCOPIC
Bacteria, UA: NONE SEEN
Bilirubin Urine: NEGATIVE
Glucose, UA: NEGATIVE mg/dL
Hgb urine dipstick: NEGATIVE
Ketones, ur: NEGATIVE mg/dL
Nitrite: NEGATIVE
Protein, ur: NEGATIVE mg/dL
Specific Gravity, Urine: 1.019 (ref 1.005–1.030)
pH: 7 (ref 5.0–8.0)

## 2017-03-06 LAB — CBC
HCT: 38.2 % (ref 36.0–46.0)
Hemoglobin: 12.7 g/dL (ref 12.0–15.0)
MCH: 28.7 pg (ref 26.0–34.0)
MCHC: 33.2 g/dL (ref 30.0–36.0)
MCV: 86.2 fL (ref 78.0–100.0)
Platelets: 316 10*3/uL (ref 150–400)
RBC: 4.43 MIL/uL (ref 3.87–5.11)
RDW: 16.6 % — ABNORMAL HIGH (ref 11.5–15.5)
WBC: 8.7 10*3/uL (ref 4.0–10.5)

## 2017-03-06 LAB — WET PREP, GENITAL
Sperm: NONE SEEN
Trich, Wet Prep: NONE SEEN
Yeast Wet Prep HPF POC: NONE SEEN

## 2017-03-06 LAB — HCG, QUANTITATIVE, PREGNANCY: hCG, Beta Chain, Quant, S: 10335 m[IU]/mL — ABNORMAL HIGH (ref <5)

## 2017-03-06 LAB — POCT PREGNANCY, URINE: Preg Test, Ur: POSITIVE — AB

## 2017-03-06 LAB — ABO/RH: ABO/RH(D): O POS

## 2017-03-06 MED ORDER — METRONIDAZOLE 500 MG PO TABS: 500.0000 mg | ORAL_TABLET | Freq: Two times a day (BID) | ORAL | 0 refills | Status: DC

## 2017-03-06 NOTE — MAU Note (Signed)
Pt complains of one episode of vaginal spotting 2 days age, since has noticed a discharge and foul odor.

## 2017-03-06 NOTE — MAU Provider Note (Signed)
History     CSN: [id number]  Arrival date and time: 03/06/17 1446   First Provider Initiated Contact with Patient 03/06/17 1530      Chief Complaint  Patient presents with  . carmping  . Vaginal Discharge   HPI   [name] is a 26 y.o. female G2P1000 here in MAU with abdominal pain. The pain started a few weeks ago. She had some spotting 2 days ago; it was very light so she didn't think anything of it. The spotting has stopped.   She currently rates her pain 7/10. The pain is located in her lower abdomen. The pain comes and goes.   The patient is very nervous about this pregnancy. She delivered a term baby at another hospital in September 2017.  The baby passed about 12 days after delivery.  The baby passed away from anoxic brain injury at birth. Patient says her baby was alive when it was born. It is very difficult for her to talk about.   LMP Jan 29 (certain)  OB History    Gravida Para Term Preterm AB Living   2 1 1  0 0 0   SAB TAB Ectopic Multiple Live Births   0 0 0 0 0      Past Medical History:  Diagnosis Date  . Headache     Past Surgical History:  Procedure Laterality Date  . NO PAST SURGERIES      Family History  Problem Relation Age of Onset  . Hypertension Mother     Social History  Substance Use Topics  . Smoking status: Current Some Day Smoker    Years: 1.00  . Smokeless tobacco: Not on file  . Alcohol use No    Allergies: No Known Allergies  Prescriptions Prior to Admission  Medication Sig Dispense Refill Last Dose  . acetaminophen (TYLENOL) 500 MG tablet Take 500-1,000 mg by mouth every 6 (six) hours as needed for mild pain, moderate pain or headache.    Past Month at Unknown time  . Prenatal Vit-Fe Fumarate-FA (PRENATAL MULTIVITAMIN) TABS tablet Take 1 tablet by mouth daily.   03/06/2017 at Unknown time   Results for orders placed or performed during the hospital encounter of 03/06/17 (from the past 48 hour(s))  Urinalysis,  Routine w reflex microscopic     Status: Abnormal   Collection Time: 03/06/17  3:00 PM  Result Value Ref Range   Color, Urine YELLOW YELLOW   APPearance CLEAR CLEAR   Specific Gravity, Urine 1.019 1.005 - 1.030   pH 7.0 5.0 - 8.0   Glucose, UA NEGATIVE NEGATIVE mg/dL   Hgb urine dipstick NEGATIVE NEGATIVE   Bilirubin Urine NEGATIVE NEGATIVE   Ketones, ur NEGATIVE NEGATIVE mg/dL   Protein, ur NEGATIVE NEGATIVE mg/dL   Nitrite NEGATIVE NEGATIVE   Leukocytes, UA SMALL (A) NEGATIVE   RBC / HPF 0-5 0 - 5 RBC/hpf   WBC, UA 0-5 0 - 5 WBC/hpf   Bacteria, UA NONE SEEN NONE SEEN   Squamous Epithelial / LPF 0-5 (A) NONE SEEN   Mucous PRESENT   Pregnancy, urine POC     Status: Abnormal   Collection Time: 03/06/17  3:06 PM  Result Value Ref Range   Preg Test, Ur POSITIVE (A) NEGATIVE    Comment:        THE SENSITIVITY OF THIS METHODOLOGY IS >24 mIU/mL   Wet prep, genital     Status: Abnormal   Collection Time: 03/06/17  3:54 PM  Result Value  Ref Range   Yeast Wet Prep HPF POC NONE SEEN NONE SEEN   Trich, Wet Prep NONE SEEN NONE SEEN   Clue Cells Wet Prep HPF POC PRESENT (A) NONE SEEN   WBC, Wet Prep HPF POC MODERATE (A) NONE SEEN    Comment: BACTERIA- TOO NUMEROUS TO COUNT   Sperm NONE SEEN   CBC     Status: Abnormal   Collection Time: 03/06/17  3:57 PM  Result Value Ref Range   WBC 8.7 4.0 - 10.5 K/uL   RBC 4.43 3.87 - 5.11 MIL/uL   Hemoglobin 12.7 12.0 - 15.0 g/dL   HCT 25.3 66.4 - 40.3 %   MCV 86.2 78.0 - 100.0 fL   MCH 28.7 26.0 - 34.0 pg   MCHC 33.2 30.0 - 36.0 g/dL   RDW 47.4 (H) 25.9 - 56.3 %   Platelets 316 150 - 400 K/uL  ABO/Rh     Status: None   Collection Time: 03/06/17  4:05 PM  Result Value Ref Range   ABO/RH(D) O POS   hCG, quantitative, pregnancy     Status: Abnormal   Collection Time: 03/06/17  4:05 PM  Result Value Ref Range   hCG, Beta Chain, Quant, S 10,335 (H) <5 mIU/mL    Comment:          GEST. AGE      CONC.  (mIU/mL)   <=1 WEEK        5 - 50      2 WEEKS       50 - 500     3 WEEKS       100 - 10,000     4 WEEKS     1,000 - 30,000     5 WEEKS     3,500 - 115,000   6-8 WEEKS     12,000 - 270,000    12 WEEKS     15,000 - 220,000        FEMALE AND NON-PREGNANT FEMALE:     LESS THAN 5 mIU/mL    US Ob Comp Less 14 Wks  Result Date: 03/06/2017 CLINICAL DATA:  Abdominal pain with positive pregnancy test. EXAM: OBSTETRIC <14 WK [name] AND TRANSVAGINAL OB US TECHNIQUE: Both transabdominal and transvaginal ultrasound examinations were performed for complete evaluation of the gestation as well as the maternal uterus, adnexal regions, and pelvic cul-de-sac. Transvaginal technique was performed to assess early pregnancy. COMPARISON:  None. FINDINGS: Intrauterine gestational sac: Single. Yolk sac:  Visualized. Embryo:  Not visualized. MSD: 10  mm   5 w   5  d Subchorionic hemorrhage:  None visualized. Maternal uterus/adnexae: Maternal ovaries are unremarkable. No evidence for intraperitoneal free fluid. IMPRESSION: Single intrauterine gestational sac with yolk sac visualized but no embryo yet apparent. Estimated gestational age by mean sac diameter is 5 week 5 day. Electronically Signed   By: Kennith Center M.D.   On: 03/06/2017 17:15   US Ob Transvaginal  Result Date: 03/06/2017 CLINICAL DATA:  Abdominal pain with positive pregnancy test. EXAM: OBSTETRIC <14 WK [name] AND TRANSVAGINAL OB US TECHNIQUE: Both transabdominal and transvaginal ultrasound examinations were performed for complete evaluation of the gestation as well as the maternal uterus, adnexal regions, and pelvic cul-de-sac. Transvaginal technique was performed to assess early pregnancy. COMPARISON:  None. FINDINGS: Intrauterine gestational sac: Single. Yolk sac:  Visualized. Embryo:  Not visualized. MSD: 10  mm   5 w   5  d Subchorionic hemorrhage:  None visualized. Maternal uterus/adnexae: Maternal  ovaries are unremarkable. No evidence for intraperitoneal free fluid. IMPRESSION: Single intrauterine  gestational sac with yolk sac visualized but no embryo yet apparent. Estimated gestational age by mean sac diameter is 5 week 5 day. Electronically Signed   By: Kennith Center M.D.   On: 03/06/2017 17:15    Review of Systems  Constitutional: Negative for fever.  Gastrointestinal: Positive for abdominal pain and nausea.   Physical Exam   Blood pressure 127/64, pulse 81, temperature 98.1 F (36.7 C), temperature source Oral, resp. rate 18, height 5\' 4"  (1.626 m), weight 186 lb (84.4 kg), last menstrual period 01/25/2017, SpO2 100 %, unknown if currently breastfeeding.  Physical Exam  Constitutional: She is oriented to person, place, and time. She appears well-developed and well-nourished. No distress.  HENT:  Head: Normocephalic.  Eyes: Pupils are equal, round, and reactive to light.  GI: Soft. Normal appearance. There is tenderness in the suprapubic area. There is no rigidity, no rebound and no guarding.  Genitourinary:  Genitourinary Comments: Vagina - Small amount of white vaginal discharge, + odor  Cervix - No contact bleeding, no active bleeding  Bimanual exam: Cervix closed Uterus non tender, enlarged  Adnexa non tender, no masses bilaterally GC/Chlam, wet prep done Chaperone present for exam.   Musculoskeletal: Normal range of motion.  Neurological: She is alert and oriented to person, place, and time.  Skin: Skin is warm. She is not diaphoretic.  Psychiatric: Her behavior is normal. Her mood appears anxious (Patient tearful ).    MAU Course  Procedures  None  MDM  Wet prep & GC Cbc & ABO [name]   Assessment and Plan   A:  SIUP @ [redacted]w[redacted]d:  1. Abdominal pain in pregnancy, antepartum   2. BV (bacterial vaginosis)     P:  Discharge home in stable condition Return to MAU if symptoms worsen  First trimester warning signs  Message sent to the [facility] for prenatal appointment Support given, patient very appreciative  RX: Flagyl   Duane Lope, NP 03/06/2017 5:45  PM

## 2017-03-06 NOTE — Discharge Instructions (Signed)
Abdominal Pain During Pregnancy °Belly (abdominal) pain is common during pregnancy. Most of the time, it is not a serious problem. Other times, it can be a sign that something is wrong with the pregnancy. Always tell your doctor if you have belly pain. °Follow these instructions at home: °Monitor your belly pain for any changes. The following actions may help you feel better: °· Do not have sex (intercourse) or put anything in your vagina until you feel better. °· Rest until your pain stops. °· Drink clear fluids if you feel sick to your stomach (nauseous). Do not eat solid food until you feel better. °· Only take medicine as told by your doctor. °· Keep all doctor visits as told. °Get help right away if: °· You are bleeding, leaking fluid, or pieces of tissue come out of your vagina. °· You have more pain or cramping. °· You keep throwing up (vomiting). °· You have pain when you pee (urinate) or have blood in your pee. °· You have a fever. °· You do not feel your baby moving as much. °· You feel very weak or feel like passing out. °· You have trouble breathing, with or without belly pain. °· You have a very bad headache and belly pain. °· You have fluid leaking from your vagina and belly pain. °· You keep having watery poop (diarrhea). °· Your belly pain does not go away after resting, or the pain gets worse. °This information is not intended to replace advice given to you by your health care provider. Make sure you discuss any questions you have with your health care provider. °Document Released: 12/02/2009 Document Revised: 07/22/2016 Document Reviewed: 07/13/2013 °Elsevier Interactive Patient Education © 2017 Elsevier Inc. ° °Bacterial Vaginosis °Bacterial vaginosis is an infection of the vagina. It happens when too many germs (bacteria) grow in the vagina. This infection puts you at risk for infections from sex (STIs). Treating this infection can lower your risk for some STIs. You should also treat this if you  are pregnant. It can cause your baby to be born early. °Follow these instructions at home: °Medicines °· Take over-the-counter and prescription medicines only as told by your doctor. °· Take or use your antibiotic medicine as told by your doctor. Do not stop taking or using it even if you start to feel better. °General instructions °· If you your sexual partner is a woman, tell her that you have this infection. She needs to get treatment if she has symptoms. If you have a female partner, he does not need to be treated. °· During treatment: °¨ Avoid sex. °¨ Do not douche. °¨ Avoid alcohol as told. °¨ Avoid breastfeeding as told. °· Drink enough fluid to keep your pee (urine) clear or pale yellow. °· Keep your vagina and butt (rectum) clean. °¨ Wash the area with warm water every day. °¨ Wipe from front to back after you use the toilet. °· Keep all follow-up visits as told by your doctor. This is important. °Preventing this condition °· Do not douche. °· Use only warm water to wash around your vagina. °· Use protection when you have sex. This includes: °¨ Latex condoms. °¨ Dental dams. °· Limit how many people you have sex with. It is best to only have sex with the same person (be monogamous). °· Get tested for STIs. Have your partner get tested. °· Wear underwear that is cotton or lined with cotton. °· Avoid tight pants and pantyhose. This is most important in summer. °·   Do not use any products that have nicotine or tobacco in them. These include cigarettes and e-cigarettes. If you need help quitting, ask your doctor. °· Do not use illegal drugs. °· Limit how much alcohol you drink. °Contact a doctor if: °· Your symptoms do not get better, even after you are treated. °· You have more discharge or pain when you pee (urinate). °· You have a fever. °· You have pain in your belly (abdomen). °· You have pain with sex. °· Your bleed from your vagina between periods. °Summary °· This infection happens when too many germs  (bacteria) grow in the vagina. °· Treating this condition can lower your risk for some infections from sex (STIs). °· You should also treat this if you are pregnant. It can cause early (premature) birth. °· Do not stop taking or using your antibiotic medicine even if you start to feel better. °This information is not intended to replace advice given to you by your health care provider. Make sure you discuss any questions you have with your health care provider. °Document Released: 09/22/2008 Document Revised: 08/29/2016 Document Reviewed: 08/29/2016 °Elsevier Interactive Patient Education © 2017 Elsevier Inc. ° °

## 2017-03-07 LAB — HIV ANTIBODY (ROUTINE TESTING W REFLEX): HIV Screen 4th Generation wRfx: NONREACTIVE

## 2017-03-08 ENCOUNTER — Encounter (HOSPITAL_COMMUNITY): Payer: Self-pay | Admitting: *Deleted

## 2017-03-08 ENCOUNTER — Inpatient Hospital Stay (HOSPITAL_COMMUNITY): Payer: Medicaid Other

## 2017-03-08 ENCOUNTER — Inpatient Hospital Stay (HOSPITAL_COMMUNITY)
Admission: AD | Admit: 2017-03-08 | Discharge: 2017-03-08 | Disposition: A | Discharge status: Home / Self Care | Payer: Medicaid Other | Source: Ambulatory Visit | Attending: Obstetrics and Gynecology | Admitting: Obstetrics and Gynecology

## 2017-03-08 DIAGNOSIS — O468X1 Other antepartum hemorrhage, first trimester: Secondary | ICD-10-CM

## 2017-03-08 DIAGNOSIS — O209 Hemorrhage in early pregnancy, unspecified: Secondary | ICD-10-CM | POA: Insufficient documentation

## 2017-03-08 DIAGNOSIS — N939 Abnormal uterine and vaginal bleeding, unspecified: Secondary | ICD-10-CM

## 2017-03-08 DIAGNOSIS — O418X10 Other specified disorders of amniotic fluid and membranes, first trimester, not applicable or unspecified: Secondary | ICD-10-CM

## 2017-03-08 DIAGNOSIS — O26891 Other specified pregnancy related conditions, first trimester: Secondary | ICD-10-CM | POA: Insufficient documentation

## 2017-03-08 DIAGNOSIS — R109 Unspecified abdominal pain: Secondary | ICD-10-CM | POA: Diagnosis not present

## 2017-03-08 DIAGNOSIS — Z3A01 Less than 8 weeks gestation of pregnancy: Secondary | ICD-10-CM | POA: Insufficient documentation

## 2017-03-08 DIAGNOSIS — O26899 Other specified pregnancy related conditions, unspecified trimester: Secondary | ICD-10-CM

## 2017-03-08 LAB — URINALYSIS, ROUTINE W REFLEX MICROSCOPIC
Bilirubin Urine: NEGATIVE
Glucose, UA: NEGATIVE mg/dL
Ketones, ur: NEGATIVE mg/dL
Nitrite: NEGATIVE
Protein, ur: NEGATIVE mg/dL
Specific Gravity, Urine: 1.016 (ref 1.005–1.030)
pH: 6 (ref 5.0–8.0)

## 2017-03-08 LAB — CBC WITH DIFFERENTIAL/PLATELET
Basophils Absolute: 0 10*3/uL (ref 0.0–0.1)
Basophils Relative: 0 %
Eosinophils Absolute: 0.2 10*3/uL (ref 0.0–0.7)
Eosinophils Relative: 2 %
HCT: 37.8 % (ref 36.0–46.0)
Hemoglobin: 13 g/dL (ref 12.0–15.0)
Lymphocytes Relative: 36 %
Lymphs Abs: 3.3 10*3/uL (ref 0.7–4.0)
MCH: 29.5 pg (ref 26.0–34.0)
MCHC: 34.4 g/dL (ref 30.0–36.0)
MCV: 85.9 fL (ref 78.0–100.0)
Monocytes Absolute: 0.6 10*3/uL (ref 0.1–1.0)
Monocytes Relative: 7 %
Neutro Abs: 5 10*3/uL (ref 1.7–7.7)
Neutrophils Relative %: 55 %
Platelets: 333 10*3/uL (ref 150–400)
RBC: 4.4 MIL/uL (ref 3.87–5.11)
RDW: 16.5 % — ABNORMAL HIGH (ref 11.5–15.5)
WBC: 9.2 10*3/uL (ref 4.0–10.5)

## 2017-03-08 LAB — HCG, QUANTITATIVE, PREGNANCY: hCG, Beta Chain, Quant, S: 17311 m[IU]/mL — ABNORMAL HIGH (ref <5)

## 2017-03-08 LAB — GC/CHLAMYDIA PROBE AMP (~~LOC~~) NOT AT ARMC
Chlamydia: NEGATIVE
Neisseria Gonorrhea: NEGATIVE

## 2017-03-08 NOTE — MAU Provider Note (Signed)
History     CSN: [id number]  Arrival date and time: 03/08/17 1604   None     No chief complaint on file.  HPI Cynthia Hull is 26 y.o. G2P1000 [redacted]w[redacted]d weeks presenting with heavy vaginal bleeding. Hx of full term pregnancy loss in Sept 2017 at [facility].  She was seen 03/06/2017 for abdominal cramping.  On that date--+UPT, BHCG 10,335, Blood type O+, Neg GC/CHL, + clues on wet prep, and U/S that showed [redacted]w[redacted]d single IUP + YS without embryo.   Bleeding she presents with today, began approx 3 hrs ago--"gush" while urinating, felt it and  saw more blood in the toilet.  Tiny amount of blood on pad that was placed on admission. Neg for pain or cramping now, had 1 cramp prior to gush of blood. Last intercourse 2-3 weeks ago.   Past Medical History:  Diagnosis Date  . Headache     Past Surgical History:  Procedure Laterality Date  . NO PAST SURGERIES      Family History  Problem Relation Age of Onset  . Hypertension Mother     Social History  Substance Use Topics  . Smoking status: Current Some Day Smoker    Years: 1.00  . Smokeless tobacco: Not on file  . Alcohol use No    Allergies: No Known Allergies  Prescriptions Prior to Admission  Medication Sig Dispense Refill Last Dose  . acetaminophen (TYLENOL) 500 MG tablet Take 500-1,000 mg by mouth every 6 (six) hours as needed for mild pain, moderate pain or headache.    Past Month at Unknown time  . metroNIDAZOLE (FLAGYL) 500 MG tablet Take 1 tablet (500 mg total) by mouth 2 (two) times daily. 14 tablet 0   . Prenatal Vit-Fe Fumarate-FA (PRENATAL MULTIVITAMIN) TABS tablet Take 1 tablet by mouth daily.   03/06/2017 at Unknown time    Review of Systems  Cardiovascular: Negative for chest pain.  Gastrointestinal: Positive for abdominal pain (1 cramp prior to bleeding.  Neg for cramping/pain since).  Genitourinary: Positive for vaginal bleeding.  Musculoskeletal: Negative for back pain.  Neurological: Negative for dizziness.    Physical Exam   Last menstrual period 01/25/2017, unknown if currently breastfeeding.  Physical Exam  Constitutional: She is oriented to person, place, and time. She appears well-developed and well-nourished. No distress.  HENT:  Head: Normocephalic.  Neck: Normal range of motion.  Cardiovascular: Normal rate.   Respiratory: Effort normal.  GI: Soft. She exhibits no distension and no mass. There is no tenderness. There is no rebound and no guarding.  Genitourinary: There is no rash, tenderness or lesion on the right labia. There is no rash, tenderness or lesion on the left labia. Uterus is enlarged (slightly). Uterus is not tender. Cervix exhibits no motion tenderness, no discharge and no friability. Right adnexum displays no mass, no tenderness and no fullness. Left adnexum displays no mass, no tenderness and no fullness. There is bleeding (small amount of pinkish blood, neg for clots) in the vagina. No tenderness in the vagina.  Neurological: She is alert and oriented to person, place, and time.  Skin: Skin is warm and dry.  Psychiatric: She has a normal mood and affect. Her behavior is normal. Judgment and thought content normal.   Results for orders placed or performed during the hospital encounter of 03/08/17 (from the past 24 hour(s))  CBC with Differential/Platelet     Status: Abnormal   Collection Time: 03/08/17  4:22 PM  Result Value Ref Range  WBC 9.2 4.0 - 10.5 K/uL   RBC 4.40 3.87 - 5.11 MIL/uL   Hemoglobin 13.0 12.0 - 15.0 g/dL   HCT 16.137.8 09.636.0 - 04.546.0 %   MCV 85.9 78.0 - 100.0 fL   MCH 29.5 26.0 - 34.0 pg   MCHC 34.4 30.0 - 36.0 g/dL   RDW 40.916.5 (H) 81.111.5 - 91.415.5 %   Platelets 333 150 - 400 K/uL   Neutrophils Relative % 55 %   Neutro Abs 5.0 1.7 - 7.7 K/uL   Lymphocytes Relative 36 %   Lymphs Abs 3.3 0.7 - 4.0 K/uL   Monocytes Relative 7 %   Monocytes Absolute 0.6 0.1 - 1.0 K/uL   Eosinophils Relative 2 %   Eosinophils Absolute 0.2 0.0 - 0.7 K/uL   Basophils  Relative 0 %   Basophils Absolute 0.0 0.0 - 0.1 K/uL  hCG, quantitative, pregnancy     Status: Abnormal   Collection Time: 03/08/17  4:22 PM  Result Value Ref Range   hCG, Beta Chain, Quant, S 17,311 (H) <5 mIU/mL  Urinalysis, Routine w reflex microscopic     Status: Abnormal   Collection Time: 03/08/17  4:45 PM  Result Value Ref Range   Color, Urine YELLOW YELLOW   APPearance CLEAR CLEAR   Specific Gravity, Urine 1.016 1.005 - 1.030   pH 6.0 5.0 - 8.0   Glucose, UA NEGATIVE NEGATIVE mg/dL   Hgb urine dipstick LARGE (A) NEGATIVE   Bilirubin Urine NEGATIVE NEGATIVE   Ketones, ur NEGATIVE NEGATIVE mg/dL   Protein, ur NEGATIVE NEGATIVE mg/dL   Nitrite NEGATIVE NEGATIVE   Leukocytes, UA TRACE (A) NEGATIVE   RBC / HPF 0-5 0 - 5 RBC/hpf   WBC, UA 0-5 0 - 5 WBC/hpf   Bacteria, UA RARE (A) NONE SEEN   Squamous Epithelial / LPF 0-5 (A) NONE SEEN   BLOOD TYPE-O Positive-from previous record  MAU Course  Procedures  GC/CHL neg on 3/10 exam-did not repeat today.  MDM MSE Labs Exam U/S  17:00 Care transferred to J. Rasch, NP Resumed care of the patient @ 1700; patient currently in US. US reviewed in detail with the patient. Discussed subchorionic hemorrhage.  Beta hcg level has increased appropriately since 3/10.  Assessment and Plan  A:      ICD-9-CM ICD-10-CM   1. Subchorionic hematoma in first trimester, single or unspecified fetus 656.83 O41.8X10 US OB Transvaginal    O46.8X1   2. Vaginal bleeding 623.8 N93.9 US OB Transvaginal     US OB Transvaginal     US OB Transvaginal  3. Abdominal pain affecting pregnancy 646.80 O26.899 US OB Transvaginal   789.00 R10.9     P:   Discharge home in stable condition  Bleeding precautions  Pelvic rest Return to MAU if symptoms worsen US in 10 days for viability.  [facility].    Duane LopeJennifer I Rasch, NP 03/08/2017 6:03 PM    Eve Judie PetitM Key 03/08/2017, 4:09 PM

## 2017-03-08 NOTE — MAU Note (Signed)
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  Recently was dx with BV, has not had the Flagyl filled yet.  She states she fell onto her knees and felt something wet from her vagina approx at 1400.  Pt thought it was the increased discharge from BV.  She also got a sharp cramp in her abd which she thought was from hunger pains.  When pt got home approx at 1600 she noticed she had blood in her panties and then in the toilet.  No clots noticed.

## 2017-03-08 NOTE — Discharge Instructions (Signed)
Subchorionic Hematoma °A subchorionic hematoma is a gathering of blood between the outer wall of the placenta and the inner wall of the womb (uterus). The placenta is the organ that connects the fetus to the wall of the uterus. The placenta performs the feeding, breathing (oxygen to the fetus), and waste removal (excretory work) of the fetus. °Subchorionic hematoma is the most common abnormality found on a result from ultrasonography done during the first trimester or early second trimester of pregnancy. If there has been little or no vaginal bleeding, early small hematomas usually shrink on their own and do not affect your baby or pregnancy. The blood is gradually absorbed over 1-2 weeks. When bleeding starts later in pregnancy or the hematoma is larger or occurs in an older pregnant woman, the outcome may not be as good. Larger hematomas may get bigger, which increases the chances for miscarriage. Subchorionic hematoma also increases the risk of premature detachment of the placenta from the uterus, preterm (premature) labor, and stillbirth. °Follow these instructions at home: °· Stay on bed rest if your health care provider recommends this. Although bed rest will not prevent more bleeding or prevent a miscarriage, your health care provider may recommend bed rest until you are advised otherwise. °· Avoid heavy lifting (more than 10 lb [4.5 kg]), exercise, sexual intercourse, or douching as directed by your health care provider. °· Keep track of the number of pads you use each day and how soaked (saturated) they are. Write down this information. °· Do not use tampons. °· Keep all follow-up appointments as directed by your health care provider. Your health care provider may ask you to have follow-up blood tests or ultrasound tests or both. °Get help right away if: °· You have severe cramps in your stomach, back, abdomen, or pelvis. °· You have a fever. °· You pass large clots or tissue. Save any tissue for your  health care provider to look at. °· Your bleeding increases or you become lightheaded, feel weak, or have fainting episodes. °This information is not intended to replace advice given to you by your health care provider. Make sure you discuss any questions you have with your health care provider. °Document Released: 03/31/2007 Document Revised: 05/21/2016 Document Reviewed: 07/13/2013 °Elsevier Interactive Patient Education © 2017 Elsevier Inc. ° °

## 2017-03-08 NOTE — MAU Note (Signed)
Pt was here two days ago and had bloodwork and ultrasound. Pt states she was at work today and felt a few gushes of blood. Pt states when she got home from work at 4, she noted a lot of vaginal bleeding. Pt denies pain and cramping.

## 2017-03-19 ENCOUNTER — Inpatient Hospital Stay (HOSPITAL_COMMUNITY)
Admission: AD | Admit: 2017-03-19 | Discharge: 2017-03-19 | Disposition: A | Discharge status: Home / Self Care | Payer: Medicaid Other | Source: Ambulatory Visit | Attending: Obstetrics & Gynecology | Admitting: Obstetrics & Gynecology

## 2017-03-19 ENCOUNTER — Ambulatory Visit (HOSPITAL_COMMUNITY)
Admission: RE | Admit: 2017-03-19 | Discharge: 2017-03-19 | Disposition: A | Discharge status: Home / Self Care | Payer: Medicaid Other | Source: Ambulatory Visit | Attending: Obstetrics and Gynecology | Admitting: Obstetrics and Gynecology

## 2017-03-19 DIAGNOSIS — Z3401 Encounter for supervision of normal first pregnancy, first trimester: Secondary | ICD-10-CM | POA: Diagnosis not present

## 2017-03-19 DIAGNOSIS — Z3A01 Less than 8 weeks gestation of pregnancy: Secondary | ICD-10-CM | POA: Insufficient documentation

## 2017-03-19 DIAGNOSIS — R109 Unspecified abdominal pain: Secondary | ICD-10-CM

## 2017-03-19 DIAGNOSIS — O208 Other hemorrhage in early pregnancy: Secondary | ICD-10-CM | POA: Diagnosis not present

## 2017-03-19 DIAGNOSIS — O468X1 Other antepartum hemorrhage, first trimester: Secondary | ICD-10-CM

## 2017-03-19 DIAGNOSIS — O418X10 Other specified disorders of amniotic fluid and membranes, first trimester, not applicable or unspecified: Secondary | ICD-10-CM | POA: Diagnosis not present

## 2017-03-19 DIAGNOSIS — O26899 Other specified pregnancy related conditions, unspecified trimester: Secondary | ICD-10-CM

## 2017-03-19 DIAGNOSIS — N939 Abnormal uterine and vaginal bleeding, unspecified: Secondary | ICD-10-CM

## 2017-03-19 DIAGNOSIS — Z3491 Encounter for supervision of normal pregnancy, unspecified, first trimester: Secondary | ICD-10-CM

## 2017-03-19 NOTE — MAU Provider Note (Signed)
History   [id number]   Chief Complaint  Patient presents with  . Follow-up    HPI Cynthia Hull is a 26 y.o. female  G2P1000 here with for follow-up ultrasound.  Patient had ultrasound on 3/12 that showed IUGS with yolk sac. Outpatient ultrasound for viability ordered but scheduled at a time when [facility] closed. Patient denies abdominal pain or vaginal bleeding.  Patient's last menstrual period was 01/25/2017 (exact date).  OB History  Gravida Para Term Preterm AB Living  2 1 1  0 0 0  SAB TAB Ectopic Multiple Live Births  0 0 0 0 0    # Outcome Date GA Lbr Len/2nd Weight Sex Delivery Anes PTL Lv  2 Current           1 Term               Past Medical History:  Diagnosis Date  . Headache     Family History  Problem Relation Age of Onset  . Hypertension Mother     Social History   Social History  . Marital status: Single    Spouse name: N/A  . Number of children: N/A  . Years of education: N/A   Social History Main Topics  . Smoking status: Former Smoker    Years: 1.00    Types: Cigars    Quit date: 02/24/2017  . Smokeless tobacco: Never Used  . Alcohol use No  . Drug use: Yes    Types: Marijuana  . Sexual activity: Yes    Birth control/ protection: None   Other Topics Concern  . Not on file   Social History Narrative  . No narrative on file    No Known Allergies  No current facility-administered medications on file prior to encounter.    Current Outpatient Prescriptions on File Prior to Encounter  Medication Sig Dispense Refill  . acetaminophen (TYLENOL) 500 MG tablet Take 500-1,000 mg by mouth every 6 (six) hours as needed for mild pain, moderate pain or headache.     . metroNIDAZOLE (FLAGYL) 500 MG tablet Take 1 tablet (500 mg total) by mouth 2 (two) times daily. (Patient not taking: Reported on 03/08/2017) 14 tablet 0  . Prenatal Vit-Fe Fumarate-FA (PRENATAL MULTIVITAMIN) TABS tablet Take 1 tablet by mouth daily.       Physical Exam   There  were no vitals filed for this visit.  Physical Exam  Constitutional: She is oriented to person, place, and time. She appears well-developed and well-nourished. No distress.  HENT:  Head: Normocephalic and atraumatic.  Eyes: Conjunctivae are normal. Right eye exhibits no discharge. Left eye exhibits no discharge. No scleral icterus.  Neck: Normal range of motion.  Respiratory: Effort normal. No respiratory distress.  Musculoskeletal: Normal range of motion.  Neurological: She is alert and oriented to person, place, and time.  Skin: Skin is warm and dry. She is not diaphoretic.  Psychiatric: She has a normal mood and affect. Her behavior is normal. Judgment and thought content normal.    MAU Course  Procedures [place] Ob Transvaginal  Result Date: 03/19/2017 CLINICAL DATA:  Vaginal bleeding, subchorionic hematoma in first trimester pregnancy, followup viability EXAM: TRANSVAGINAL OB ULTRASOUND TECHNIQUE: Transvaginal ultrasound was performed for complete evaluation of the gestation as well as the maternal uterus, adnexal regions, and pelvic cul-de-sac. COMPARISON:  03/08/2017 FINDINGS: Intrauterine gestational sac: Present Yolk sac:  Present Embryo:  Present Cardiac Activity: Present Heart Rate: 143 bpm CRL:   9.0  mm   6 w  6 d                  [name] EDC: 11/06/2017 Subchorionic hemorrhage: Moderate subchorionic hemorrhage again identified Maternal uterus/adnexae: Nonvisualization LEFT ovary. RIGHT ovary normal size morphology 4.7 x 2.7 x 3.9 cm. No free pelvic fluid or adnexal masses. IMPRESSION: Single live intrauterine gestation as above. Again identified moderate subchorionic hemorrhage. Nonvisualization LEFT ovary. Electronically Signed   By: Ulyses Southward M.D.   On: 03/19/2017 15:02    MDM Ultrasound today shows SIUP with cardiac activity. Moderate SCH remains  Assessment and Plan  25 y.o. G2P1000 at [redacted]w[redacted]d IUP  1. Normal IUP (intrauterine pregnancy) on prenatal ultrasound, first trimester   2.  Subchorionic hematoma in first trimester, single or unspecified fetus    P: Discharge home Pelvic rest Start prenatal care Discussed reasons to come to MAU  Judeth Horn, NP 03/19/2017 2:44 PM

## 2017-03-19 NOTE — Discharge Instructions (Signed)
Subchorionic Hematoma °A subchorionic hematoma is a gathering of blood between the outer wall of the placenta and the inner wall of the womb (uterus). The placenta is the organ that connects the fetus to the wall of the uterus. The placenta performs the feeding, breathing (oxygen to the fetus), and waste removal (excretory work) of the fetus. °Subchorionic hematoma is the most common abnormality found on a result from ultrasonography done during the first trimester or early second trimester of pregnancy. If there has been little or no vaginal bleeding, early small hematomas usually shrink on their own and do not affect your baby or pregnancy. The blood is gradually absorbed over 1-2 weeks. When bleeding starts later in pregnancy or the hematoma is larger or occurs in an older pregnant woman, the outcome may not be as good. Larger hematomas may get bigger, which increases the chances for miscarriage. Subchorionic hematoma also increases the risk of premature detachment of the placenta from the uterus, preterm (premature) labor, and stillbirth. °Follow these instructions at home: °· Stay on bed rest if your health care provider recommends this. Although bed rest will not prevent more bleeding or prevent a miscarriage, your health care provider may recommend bed rest until you are advised otherwise. °· Avoid heavy lifting (more than 10 lb [4.5 kg]), exercise, sexual intercourse, or douching as directed by your health care provider. °· Keep track of the number of pads you use each day and how soaked (saturated) they are. Write down this information. °· Do not use tampons. °· Keep all follow-up appointments as directed by your health care provider. Your health care provider may ask you to have follow-up blood tests or ultrasound tests or both. °Get help right away if: °· You have severe cramps in your stomach, back, abdomen, or pelvis. °· You have a fever. °· You pass large clots or tissue. Save any tissue for your  health care provider to look at. °· Your bleeding increases or you become lightheaded, feel weak, or have fainting episodes. °This information is not intended to replace advice given to you by your health care provider. Make sure you discuss any questions you have with your health care provider. °Document Released: 03/31/2007 Document Revised: 05/21/2016 Document Reviewed: 07/13/2013 °Elsevier Interactive Patient Education © 2017 Elsevier Inc. ° °

## 2017-03-23 ENCOUNTER — Telehealth: Payer: Self-pay

## 2017-03-23 NOTE — Telephone Encounter (Signed)
Pt called the front office wanted to know if she could get her results of her US instead coming for a office visit for US results.  Notified [facility] and was advised that pt could receive viable pregnancy results on the phone.  LM on pt VM stating that we are calling with results as she had requested to return call back to the office.

## 2017-04-01 ENCOUNTER — Inpatient Hospital Stay (HOSPITAL_COMMUNITY)
Admission: AD | Admit: 2017-04-01 | Discharge: 2017-04-01 | Discharge status: Against Medical Advice | Payer: Medicaid Other | Source: Ambulatory Visit | Attending: Obstetrics & Gynecology | Admitting: Obstetrics & Gynecology

## 2017-04-01 DIAGNOSIS — Z5321 Procedure and treatment not carried out due to patient leaving prior to being seen by health care provider: Secondary | ICD-10-CM | POA: Insufficient documentation

## 2017-04-01 LAB — URINALYSIS, ROUTINE W REFLEX MICROSCOPIC
Bilirubin Urine: NEGATIVE
Glucose, UA: NEGATIVE mg/dL
Hgb urine dipstick: NEGATIVE
Ketones, ur: 5 mg/dL — AB
Nitrite: NEGATIVE
Protein, ur: NEGATIVE mg/dL
Specific Gravity, Urine: 1.031 — ABNORMAL HIGH (ref 1.005–1.030)
pH: 5 (ref 5.0–8.0)

## 2017-04-01 NOTE — MAU Note (Signed)
Pt called, not in lobby.  Did not tell staff she was leaving.

## 2017-04-01 NOTE — Progress Notes (Signed)
Patient not in lobby when called back to room.  FIrst Call.

## 2017-04-01 NOTE — MAU Note (Signed)
Pt called, not in lobby 

## 2017-04-01 NOTE — MAU Note (Signed)
Pt C/O vomiting for about the last 3 weeks, is unable to drink water as she needs to do at work.  Denies pain or bleeding.

## 2017-04-07 ENCOUNTER — Ambulatory Visit (INDEPENDENT_AMBULATORY_CARE_PROVIDER_SITE_OTHER): Payer: Medicaid Other | Admitting: Clinical

## 2017-04-07 ENCOUNTER — Ambulatory Visit (INDEPENDENT_AMBULATORY_CARE_PROVIDER_SITE_OTHER): Payer: Medicaid Other | Admitting: Obstetrics and Gynecology

## 2017-04-07 ENCOUNTER — Encounter: Payer: Self-pay | Admitting: Obstetrics and Gynecology

## 2017-04-07 DIAGNOSIS — Z3491 Encounter for supervision of normal pregnancy, unspecified, first trimester: Secondary | ICD-10-CM | POA: Diagnosis not present

## 2017-04-07 DIAGNOSIS — O09299 Supervision of pregnancy with other poor reproductive or obstetric history, unspecified trimester: Secondary | ICD-10-CM

## 2017-04-07 DIAGNOSIS — F4321 Adjustment disorder with depressed mood: Secondary | ICD-10-CM

## 2017-04-07 DIAGNOSIS — F4320 Adjustment disorder, unspecified: Secondary | ICD-10-CM

## 2017-04-07 DIAGNOSIS — Z8759 Personal history of other complications of pregnancy, childbirth and the puerperium: Secondary | ICD-10-CM | POA: Insufficient documentation

## 2017-04-07 DIAGNOSIS — O09291 Supervision of pregnancy with other poor reproductive or obstetric history, first trimester: Secondary | ICD-10-CM

## 2017-04-07 DIAGNOSIS — Z3490 Encounter for supervision of normal pregnancy, unspecified, unspecified trimester: Secondary | ICD-10-CM

## 2017-04-07 LAB — POCT URINALYSIS DIP (DEVICE)
Bilirubin Urine: NEGATIVE
Glucose, UA: NEGATIVE mg/dL
Hgb urine dipstick: NEGATIVE
Ketones, ur: NEGATIVE mg/dL
Nitrite: NEGATIVE
Protein, ur: NEGATIVE mg/dL
Specific Gravity, Urine: 1.02 (ref 1.005–1.030)
Urobilinogen, UA: 1 mg/dL (ref 0.0–1.0)
pH: 7 (ref 5.0–8.0)

## 2017-04-07 NOTE — BH Specialist Note (Signed)
Integrated Behavioral Health Initial Visit  MRN: [id number] Name: Cynthia Hull   Session Start time: 11:00 Session End time: 11:30 Total time: 30 minutes  Type of Service: Integrated Behavioral Health- Individual/Family Interpretor:No. Interpretor Name and Language: n/a   Warm Hand Off Completed.       SUBJECTIVE: Cynthia Hull is a 26 y.o. female accompanied by patient. Patient was referred by Venia Carbon, NP for grief. Patient reports the following symptoms/concerns: Pt primary concern today is that current pregnancy is bringing up feelings of grief over fetal loss at 40wks with last pregnancy; says it helps to talk about it to work through feelings for emotional wellness in current pregnancy. Duration of problem: Less than 6 months; Severity of problem: moderate  OBJECTIVE: Mood: Depressed and Affect: Depressed and Tearful Risk of harm to self or others: No plan to harm self or others   LIFE CONTEXT: Family and Social: Lives with fiancee; family and friends all very supportive. Pt went through [facility] support prior to current pregnancy, and found it helpful. School/Work: Working Economist: Sleeping and eating "slowly getting back to normal", and no current substance use Life Changes: Fetal loss at 40wks, almost 6 months prior, back to work fulltime three months ago, current pregnancy  GOALS ADDRESSED: Patient will reduce symptoms of: Grief and increase knowledge and/or ability of: self-management skills and also: Healthy grieving over loss   INTERVENTIONS: Motivational Interviewing, Supportive Counseling and Psychoeducation and/or Health Education  Standardized Assessments completed: none today  ASSESSMENT: Patient currently experiencing Grief. Patient may benefit from psychoeducation and brief therapeutic intervention regarding coping with symptoms of depression related to grief.  PLAN: 1. Follow up with behavioral health clinician on : One month, or  as needed by patient 2. Behavioral recommendations:  -Read educational materials regarding coping with symptoms of depression and anxiety related to grief -Consider using meditation apps during work breaks, as needed 3. Referral(s): Integrated [employer] (In Clinic) 4. "From scale of 1-10, how likely are you to follow plan?": 9  Rae Lips, LCSWA  Depression screen [facility] 2/9 04/07/2017  Decreased Interest 0  Down, Depressed, Hopeless 1  PHQ - 2 Score 1  Altered sleeping 0  Tired, decreased energy 0  Change in appetite 0  Feeling bad or failure about yourself  0  Trouble concentrating 0  Moving slowly or fidgety/restless 0  Suicidal thoughts 0  PHQ-9 Score 1   GAD 7 : Generalized Anxiety Score 04/07/2017  Nervous, Anxious, on Edge 0  Control/stop worrying 0  Worry too much - different things 1  Trouble relaxing 0  Restless 0  Easily annoyed or irritable 1  Afraid - awful might happen 1  Total GAD 7 Score 3

## 2017-04-07 NOTE — Progress Notes (Signed)
Discuss birthing plan

## 2017-04-07 NOTE — Progress Notes (Signed)
   PRENATAL VISIT NOTE  Subjective:  Cynthia Hull is a 26 y.o. G2P1000 at [redacted]w[redacted]d being seen today for ongoing prenatal care.  She is currently monitored for the following issues for this low-risk pregnancy and has Abdominal pain affecting pregnancy; Supervision of low-risk pregnancy; and Pregnancy with history of neonatal death on her problem Hull. History of neonatal death, baby lived 14 days in NICU and passed away. Patient says the baby did not have health problems and died of "lack of oxygen".  Very difficult for patient to discuss. Says she had a "perfect" pregnancy".   Patient reports no complaints.   Movement: Absent. Denies leaking of fluid.   The following portions of the patient's history were reviewed and updated as appropriate: allergies, current medications, past family history, past medical history, past social history, past surgical history and problem Hull. Problem Hull updated.  Objective:   Vitals:   04/07/17 0932  BP: 125/61  Pulse: 80  Weight: 176 lb 1.6 oz (79.9 kg)    Fetal Status: Fetal Heart Rate (bpm): 172   Movement: Absent    BP 125/61   Pulse 80   Wt 176 lb 1.6 oz (79.9 kg)   LMP 01/25/2017 (Exact Date)   BMI 30.23 kg/m  Uterine Size: size equals dates  Pelvic Exam: Deferred    Skin: normal coloration and turgor, no rashes    Neurologic: negative   Extremities: normal strength, tone, and muscle mass   HEENT neck supple with midline trachea and thyroid without masses   Mouth/Teeth mucous membranes moist, pharynx normal without lesions   Neck supple and no masses   Cardiovascular: regular rate and rhythm, no murmurs or gallops   Respiratory:  appears well, vitals normal, no respiratory distress, acyanotic, normal RR, neck free of mass or lymphadenopathy, chest clear, no wheezing, crepitations, rhonchi, normal symmetric air entry   Abdomen: soft, non-tender; bowel sounds normal; no masses,  no organomegaly    Assessment and Plan:  Pregnancy: G2P1000  at [redacted]w[redacted]d  1. History of fetal loss  - Ambulatory referral to [facility]  2. Encounter for supervision of low-risk pregnancy, antepartum  - Prenatal Profile I - [name] MFM Fetal Nuchal Translucency; Future - PAP not done today, will await previous prenatal records from [facility] [place].  - Will discuss labor plan of care once records are received.   Preterm labor symptoms and general obstetric precautions including but not limited to vaginal bleeding, contractions, leaking of fluid and fetal movement were reviewed in detail with the patient. Please refer to After Visit Summary for other counseling recommendations.   Initial labs drawn. Continue prenatal vitamins. Genetic Screening discussed, First trimester screen requested, Nuchal Translucency ordered.  Ultrasound discussed; fetal anatomic survey: requested Problem Hull reviewed and updated. The nature of  - [facility] with multiple MDs and other Advanced Practice Providers was explained to patient; also emphasized that residents, students are part of our team. Routine obstetric precautions reviewed.   Duane Lope, NP

## 2017-04-08 ENCOUNTER — Encounter: Payer: Self-pay | Admitting: Obstetrics and Gynecology

## 2017-04-08 DIAGNOSIS — O09299 Supervision of pregnancy with other poor reproductive or obstetric history, unspecified trimester: Secondary | ICD-10-CM | POA: Insufficient documentation

## 2017-04-08 LAB — PRENATAL PROFILE I(LABCORP)
Antibody Screen: NEGATIVE
Basophils Absolute: 0 10*3/uL (ref 0.0–0.2)
Basos: 0 %
EOS (ABSOLUTE): 0.1 10*3/uL (ref 0.0–0.4)
Eos: 1 %
Hematocrit: 38.1 % (ref 34.0–46.6)
Hemoglobin: 13.3 g/dL (ref 11.1–15.9)
Hepatitis B Surface Ag: NEGATIVE
Immature Grans (Abs): 0 10*3/uL (ref 0.0–0.1)
Immature Granulocytes: 0 %
Lymphocytes Absolute: 2 10*3/uL (ref 0.7–3.1)
Lymphs: 22 %
MCH: 30.9 pg (ref 26.6–33.0)
MCHC: 34.9 g/dL (ref 31.5–35.7)
MCV: 89 fL (ref 79–97)
Monocytes Absolute: 0.8 10*3/uL (ref 0.1–0.9)
Monocytes: 10 %
Neutrophils Absolute: 5.9 10*3/uL (ref 1.4–7.0)
Neutrophils: 67 %
Platelets: 344 10*3/uL (ref 150–379)
RBC: 4.3 x10E6/uL (ref 3.77–5.28)
RDW: 16.5 % — ABNORMAL HIGH (ref 12.3–15.4)
RPR Ser Ql: NONREACTIVE
Rh Factor: POSITIVE
Rubella Antibodies, IGG: 1.59 index (ref >0.99)
WBC: 8.8 10*3/uL (ref 3.4–10.8)

## 2017-04-12 ENCOUNTER — Encounter: Payer: Self-pay | Admitting: Obstetrics and Gynecology

## 2017-04-13 ENCOUNTER — Encounter: Payer: Medicaid Other | Admitting: Obstetrics & Gynecology

## 2017-04-21 ENCOUNTER — Encounter (HOSPITAL_COMMUNITY): Payer: Self-pay

## 2017-04-21 ENCOUNTER — Ambulatory Visit (HOSPITAL_COMMUNITY)
Admission: RE | Admit: 2017-04-21 | Discharge: 2017-04-21 | Disposition: A | Discharge status: Home / Self Care | Payer: Medicaid Other | Source: Ambulatory Visit | Attending: Obstetrics and Gynecology | Admitting: Obstetrics and Gynecology

## 2017-04-21 DIAGNOSIS — Z3490 Encounter for supervision of normal pregnancy, unspecified, unspecified trimester: Secondary | ICD-10-CM

## 2017-04-21 DIAGNOSIS — O09291 Supervision of pregnancy with other poor reproductive or obstetric history, first trimester: Secondary | ICD-10-CM | POA: Diagnosis not present

## 2017-04-21 DIAGNOSIS — Z3682 Encounter for antenatal screening for nuchal translucency: Secondary | ICD-10-CM | POA: Insufficient documentation

## 2017-04-21 DIAGNOSIS — O09891 Supervision of other high risk pregnancies, first trimester: Secondary | ICD-10-CM | POA: Insufficient documentation

## 2017-04-21 DIAGNOSIS — Z3A12 12 weeks gestation of pregnancy: Secondary | ICD-10-CM | POA: Diagnosis not present

## 2017-04-22 ENCOUNTER — Other Ambulatory Visit (HOSPITAL_COMMUNITY): Payer: Self-pay | Admitting: *Deleted

## 2017-04-22 DIAGNOSIS — Z3689 Encounter for other specified antenatal screening: Secondary | ICD-10-CM

## 2017-04-30 ENCOUNTER — Other Ambulatory Visit: Payer: Self-pay

## 2017-05-05 ENCOUNTER — Encounter: Payer: Self-pay | Admitting: General Practice

## 2017-05-05 ENCOUNTER — Telehealth: Payer: Self-pay | Admitting: *Deleted

## 2017-05-05 ENCOUNTER — Ambulatory Visit (INDEPENDENT_AMBULATORY_CARE_PROVIDER_SITE_OTHER): Payer: Medicaid Other | Admitting: Medical

## 2017-05-05 DIAGNOSIS — Z3490 Encounter for supervision of normal pregnancy, unspecified, unspecified trimester: Secondary | ICD-10-CM

## 2017-05-05 DIAGNOSIS — Z3482 Encounter for supervision of other normal pregnancy, second trimester: Secondary | ICD-10-CM

## 2017-05-05 NOTE — Patient Instructions (Signed)
Second Trimester of Pregnancy The second trimester is from week 13 through week 28, month 4 through 6. This is often the time in pregnancy that you feel your best. Often times, morning sickness has lessened or quit. You may have more energy, and you may get hungry more often. Your unborn baby (fetus) is growing rapidly. At the end of the sixth month, he or she is about 9 inches long and weighs about 1 pounds. You will likely feel the baby move (quickening) between 18 and 20 weeks of pregnancy. Follow these instructions at home:  Avoid all smoking, herbs, and alcohol. Avoid drugs not approved by your doctor.  Do not use any tobacco products, including cigarettes, chewing tobacco, and electronic cigarettes. If you need help quitting, ask your doctor. You may get counseling or other support to help you quit.  Only take medicine as told by your doctor. Some medicines are safe and some are not during pregnancy.  Exercise only as told by your doctor. Stop exercising if you start having cramps.  Eat regular, healthy meals.  Wear a good support bra if your breasts are tender.  Do not use hot tubs, steam rooms, or saunas.  Wear your seat belt when driving.  Avoid raw meat, uncooked cheese, and liter boxes and soil used by cats.  Take your prenatal vitamins.  Take 1500-2000 milligrams of calcium daily starting at the 20th week of pregnancy until you deliver your baby.  Try taking medicine that helps you poop (stool softener) as needed, and if your doctor approves. Eat more fiber by eating fresh fruit, vegetables, and whole grains. Drink enough fluids to keep your pee (urine) clear or pale yellow.  Take warm water baths (sitz baths) to soothe pain or discomfort caused by hemorrhoids. Use hemorrhoid cream if your doctor approves.  If you have puffy, bulging veins (varicose veins), wear support hose. Raise (elevate) your feet for 15 minutes, 3-4 times a day. Limit salt in your diet.  Avoid heavy  lifting, wear low heals, and sit up straight.  Rest with your legs raised if you have leg cramps or low back pain.  Visit your dentist if you have not gone during your pregnancy. Use a soft toothbrush to brush your teeth. Be gentle when you floss.  You can have sex (intercourse) unless your doctor tells you not to.  Go to your doctor visits. Get help if:  You feel dizzy.  You have mild cramps or pressure in your lower belly (abdomen).  You have a nagging pain in your belly area.  You continue to feel sick to your stomach (nauseous), throw up (vomit), or have watery poop (diarrhea).  You have bad smelling fluid coming from your vagina.  You have pain with peeing (urination). Get help right away if:  You have a fever.  You are leaking fluid from your vagina.  You have spotting or bleeding from your vagina.  You have severe belly cramping or pain.  You lose or gain weight rapidly.  You have trouble catching your breath and have chest pain.  You notice sudden or extreme puffiness (swelling) of your face, hands, ankles, feet, or legs.  You have not felt the baby move in over an hour.  You have severe headaches that do not go away with medicine.  You have vision changes. This information is not intended to replace advice given to you by your health care provider. Make sure you discuss any questions you have with your health care   provider. Document Released: 03/10/2010 Document Revised: 05/21/2016 Document Reviewed: 02/14/2013 Elsevier Interactive Patient Education  2017 Elsevier Inc.  

## 2017-05-05 NOTE — Progress Notes (Signed)
   PRENATAL VISIT NOTE  Subjective:  Cynthia Hull is a 26 y.o. G2P1000 at 2428w2d being seen today for ongoing prenatal care.  She is currently monitored for the following issues for this low-risk pregnancy and has Abdominal pain affecting pregnancy; Supervision of low-risk pregnancy; and Pregnancy with history of neonatal death on her problem list.  Patient reports no complaints.  Contractions: Not present. Vag. Bleeding: None.   . Denies leaking of fluid.   The following portions of the patient's history were reviewed and updated as appropriate: allergies, current medications, past family history, past medical history, past social history, past surgical history and problem list. Problem list updated.  Objective:   Vitals:   05/05/17 1138  BP: 119/62  Pulse: 75  Weight: 176 lb 12.8 oz (80.2 kg)    Fetal Status: Fetal Heart Rate (bpm): 152         General:  Alert, oriented and cooperative. Patient is in no acute distress.  Skin: Skin is warm and dry. No rash noted.   Cardiovascular: Normal heart rate noted  Respiratory: Normal respiratory effort, no problems with respiration noted  Abdomen: Soft, gravid, appropriate for gestational age. Pain/Pressure: Absent     Pelvic:  Cervical exam deferred        Extremities: Normal range of motion.  Edema: None  Mental Status: Normal mood and affect. Normal behavior. Normal judgment and thought content.   Assessment and Plan:  Pregnancy: G2P1000 at 2728w2d  1. Encounter for supervision of low-risk pregnancy, antepartum - Patient doing well - NT results - normal - reviewed with patient - Patient discussed desire for a primary C/S after traumatic birth of last child resulting in neonatal death. Advised that we would discuss further with MD in third trimester - AFP at next visit to complete genetic screening. Patient advised  - Anatomy US already scheduled for next month  Second trimester precautions and general obstetric precautions including  but not limited to vaginal bleeding, contractions, leaking of fluid and fetal movement were reviewed in detail with the patient. Please refer to After Visit Summary for other counseling recommendations.  Return in about 4 weeks (around 06/02/2017) for LOB.   Marny LowensteinWenzel, Julie N, PA-C

## 2017-05-05 NOTE — Progress Notes (Signed)
1115-Called patient in lobby, no response 1120-Attempted to call in lobby, no response

## 2017-05-05 NOTE — Telephone Encounter (Signed)
Attempted to call Ms Cynthia Hull d/t not being in lobby x2 calls. Patient answered and stated she "went up the road" to get something to eat. Stated the kiosk told her the wait time was 30 min. I let her know that I have been calling for her. Patient coming back to clinic.

## 2017-05-20 ENCOUNTER — Ambulatory Visit: Payer: Medicaid Other | Admitting: *Deleted

## 2017-05-20 DIAGNOSIS — Z3490 Encounter for supervision of normal pregnancy, unspecified, unspecified trimester: Secondary | ICD-10-CM

## 2017-05-20 NOTE — Progress Notes (Signed)
Pt has been concerned that she has not felt fetal movement yet for this pregnancy. She has been told by friends that during the second pregnancy it is common to feel movement by the 14th week. Pt scheduled nurse visit today to hear the heartbeat for reassurance. Pt was advised that she will most likely begin to feel movement within the next 2 weeks and that 14 weeks is too early to feel movement. Per doppler, fetal movement was audible yet difficult to auscultate FHR. Informal bedside US performed - FHR 152 per PW doppler.  Pt felt reassured after seeing baby on US monitor screen and hearing the heartbeat.

## 2017-06-01 ENCOUNTER — Ambulatory Visit (INDEPENDENT_AMBULATORY_CARE_PROVIDER_SITE_OTHER): Payer: Medicaid Other | Admitting: Medical

## 2017-06-01 VITALS — BP 111/61 | HR 82 | Wt 170.4 lb

## 2017-06-01 DIAGNOSIS — Z3492 Encounter for supervision of normal pregnancy, unspecified, second trimester: Secondary | ICD-10-CM

## 2017-06-01 NOTE — Progress Notes (Signed)
   PRENATAL VISIT NOTE  Subjective:  Alfonse AlpersMaiye Nelda SevereWaller is a 26 y.o. G2P1000 at 5430w1d being seen today for ongoing prenatal care.  She is currently monitored for the following issues for this low-risk pregnancy and has Abdominal pain affecting pregnancy; Supervision of low-risk pregnancy; and Pregnancy with history of neonatal death on her problem list.  Patient reports no complaints.  Contractions: Not present. Vag. Bleeding: None.  Movement: Present. Denies leaking of fluid.   The following portions of the patient's history were reviewed and updated as appropriate: allergies, current medications, past family history, past medical history, past social history, past surgical history and problem list. Problem list updated.  Objective:   Vitals:   06/01/17 0843  BP: 111/61  Pulse: 82  Weight: 170 lb 6.4 oz (77.3 kg)    Fetal Status: Fetal Heart Rate (bpm): 148   Movement: Present     General:  Alert, oriented and cooperative. Patient is in no acute distress.  Skin: Skin is warm and dry. No rash noted.   Cardiovascular: Normal heart rate noted  Respiratory: Normal respiratory effort, no problems with respiration noted  Abdomen: Soft, gravid, appropriate for gestational age. Pain/Pressure: Absent     Pelvic:  Cervical exam deferred        Extremities: Normal range of motion.  Edema: None  Mental Status: Normal mood and affect. Normal behavior. Normal judgment and thought content.   Assessment and Plan:  Pregnancy: G2P1000 at 3530w1d  1. Supervision of low-risk pregnancy, second trimester - AFP, Serum, Open Spina Bifida - Anatomy US scheduled  Second trimester warning symptoms and general obstetric precautions including but not limited to vaginal bleeding, contractions, leaking of fluid and fetal movement were reviewed in detail with the patient. Please refer to After Visit Summary for other counseling recommendations.  Return in about 4 weeks (around 06/29/2017) for LOB.   Vonzella NippleJulie Wenzel,  PA-C

## 2017-06-01 NOTE — Patient Instructions (Signed)
Second Trimester of Pregnancy The second trimester is from week 13 through week 28, month 4 through 6. This is often the time in pregnancy that you feel your best. Often times, morning sickness has lessened or quit. You may have more energy, and you may get hungry more often. Your unborn baby (fetus) is growing rapidly. At the end of the sixth month, he or she is about 9 inches long and weighs about 1 pounds. You will likely feel the baby move (quickening) between 18 and 20 weeks of pregnancy. Follow these instructions at home:  Avoid all smoking, herbs, and alcohol. Avoid drugs not approved by your doctor.  Do not use any tobacco products, including cigarettes, chewing tobacco, and electronic cigarettes. If you need help quitting, ask your doctor. You may get counseling or other support to help you quit.  Only take medicine as told by your doctor. Some medicines are safe and some are not during pregnancy.  Exercise only as told by your doctor. Stop exercising if you start having cramps.  Eat regular, healthy meals.  Wear a good support bra if your breasts are tender.  Do not use hot tubs, steam rooms, or saunas.  Wear your seat belt when driving.  Avoid raw meat, uncooked cheese, and liter boxes and soil used by cats.  Take your prenatal vitamins.  Take 1500-2000 milligrams of calcium daily starting at the 20th week of pregnancy until you deliver your baby.  Try taking medicine that helps you poop (stool softener) as needed, and if your doctor approves. Eat more fiber by eating fresh fruit, vegetables, and whole grains. Drink enough fluids to keep your pee (urine) clear or pale yellow.  Take warm water baths (sitz baths) to soothe pain or discomfort caused by hemorrhoids. Use hemorrhoid cream if your doctor approves.  If you have puffy, bulging veins (varicose veins), wear support hose. Raise (elevate) your feet for 15 minutes, 3-4 times a day. Limit salt in your diet.  Avoid heavy  lifting, wear low heals, and sit up straight.  Rest with your legs raised if you have leg cramps or low back pain.  Visit your dentist if you have not gone during your pregnancy. Use a soft toothbrush to brush your teeth. Be gentle when you floss.  You can have sex (intercourse) unless your doctor tells you not to.  Go to your doctor visits. Get help if:  You feel dizzy.  You have mild cramps or pressure in your lower belly (abdomen).  You have a nagging pain in your belly area.  You continue to feel sick to your stomach (nauseous), throw up (vomit), or have watery poop (diarrhea).  You have bad smelling fluid coming from your vagina.  You have pain with peeing (urination). Get help right away if:  You have a fever.  You are leaking fluid from your vagina.  You have spotting or bleeding from your vagina.  You have severe belly cramping or pain.  You lose or gain weight rapidly.  You have trouble catching your breath and have chest pain.  You notice sudden or extreme puffiness (swelling) of your face, hands, ankles, feet, or legs.  You have not felt the baby move in over an hour.  You have severe headaches that do not go away with medicine.  You have vision changes. This information is not intended to replace advice given to you by your health care provider. Make sure you discuss any questions you have with your health care   provider. Document Released: 03/10/2010 Document Revised: 05/21/2016 Document Reviewed: 02/14/2013 Elsevier Interactive Patient Education  2017 Elsevier Inc.  

## 2017-06-01 NOTE — Progress Notes (Signed)
AFP today

## 2017-06-04 LAB — AFP, SERUM, OPEN SPINA BIFIDA
AFP MoM: 1.06
AFP Value: 46.7 ng/mL
Gest. Age on Collection Date: 18.1 weeks
Maternal Age At EDD: 24.8 yr
OSBR Risk 1 IN: 10000
Test Results:: NEGATIVE
Weight: 170 [lb_av]

## 2017-06-08 ENCOUNTER — Ambulatory Visit (HOSPITAL_COMMUNITY)
Admission: RE | Admit: 2017-06-08 | Discharge: 2017-06-08 | Disposition: A | Discharge status: Home / Self Care | Payer: Medicaid Other | Source: Ambulatory Visit | Attending: Obstetrics and Gynecology | Admitting: Obstetrics and Gynecology

## 2017-06-08 ENCOUNTER — Other Ambulatory Visit (HOSPITAL_COMMUNITY): Payer: Self-pay | Admitting: Obstetrics and Gynecology

## 2017-06-08 DIAGNOSIS — Z3A19 19 weeks gestation of pregnancy: Secondary | ICD-10-CM | POA: Diagnosis not present

## 2017-06-08 DIAGNOSIS — Z3689 Encounter for other specified antenatal screening: Secondary | ICD-10-CM

## 2017-06-08 DIAGNOSIS — O09292 Supervision of pregnancy with other poor reproductive or obstetric history, second trimester: Secondary | ICD-10-CM | POA: Insufficient documentation

## 2017-06-08 DIAGNOSIS — O09892 Supervision of other high risk pregnancies, second trimester: Secondary | ICD-10-CM | POA: Insufficient documentation

## 2017-06-16 ENCOUNTER — Encounter: Payer: Self-pay | Admitting: *Deleted

## 2017-07-06 ENCOUNTER — Ambulatory Visit (INDEPENDENT_AMBULATORY_CARE_PROVIDER_SITE_OTHER): Payer: PRIVATE HEALTH INSURANCE | Admitting: Obstetrics and Gynecology

## 2017-07-06 ENCOUNTER — Encounter: Payer: Self-pay | Admitting: Advanced Practice Midwife

## 2017-07-06 VITALS — BP 109/60 | HR 93 | Wt 163.4 lb

## 2017-07-06 DIAGNOSIS — Z3492 Encounter for supervision of normal pregnancy, unspecified, second trimester: Secondary | ICD-10-CM

## 2017-07-06 DIAGNOSIS — O2612 Low weight gain in pregnancy, second trimester: Secondary | ICD-10-CM

## 2017-07-06 DIAGNOSIS — O09292 Supervision of pregnancy with other poor reproductive or obstetric history, second trimester: Secondary | ICD-10-CM

## 2017-07-06 DIAGNOSIS — O09299 Supervision of pregnancy with other poor reproductive or obstetric history, unspecified trimester: Secondary | ICD-10-CM

## 2017-07-06 DIAGNOSIS — O2610 Low weight gain in pregnancy, unspecified trimester: Secondary | ICD-10-CM | POA: Insufficient documentation

## 2017-07-06 MED ORDER — METOCLOPRAMIDE HCL 10 MG PO TABS
10.0000 mg | ORAL_TABLET | Freq: Three times a day (TID) | ORAL | 1 refills | Status: DC
Start: 2017-07-06 — End: 2017-10-24

## 2017-07-06 NOTE — Progress Notes (Signed)
   PRENATAL VISIT NOTE  Subjective:  Cynthia Hull is a 26 y.o. G2P1000 at 3681w1d being seen today for ongoing prenatal care.  She is currently monitored for the following issues for this low-risk pregnancy and has Abdominal pain affecting pregnancy; Supervision of low-risk pregnancy; Pregnancy with history of neonatal death; and Poor weight gain of pregnancy on her problem list.  Patient reports no complaints.  Contractions: Not present. Vag. Bleeding: None.  Movement: Present. Denies leaking of fluid.   The following portions of the patient's history were reviewed and updated as appropriate: allergies, current medications, past family history, past medical history, past social history, past surgical history and problem list. Problem list updated.  Objective:   Vitals:   07/06/17 0856  BP: 109/60  Pulse: 93  Weight: 163 lb 6.4 oz (74.1 kg)    Fetal Status: Fetal Heart Rate (bpm): 154 Fundal Height: 24 cm Movement: Present     General:  Alert, oriented and cooperative. Patient is in no acute distress.  Skin: Skin is warm and dry. No rash noted.   Cardiovascular: Normal heart rate noted  Respiratory: Normal respiratory effort, no problems with respiration noted  Abdomen: Soft, gravid, appropriate for gestational age. Pain/Pressure: Absent     Pelvic:  Cervical exam deferred        Extremities: Normal range of motion.  Edema: None  Mental Status: Normal mood and affect. Normal behavior. Normal judgment and thought content.   Assessment and Plan:  Pregnancy: G2P1000 at 781w1d  1. Low weight gain during pregnancy in second trimester  - US MFM OB FOLLOW UP; Future - Likely secondary to Nausea, Rx for Reglan.  - 13 lbs weight loss. Encouraged Boost shakes, protein shakes, frequent healthy snacks with protein.   2. Encounter for supervision of low-risk pregnancy in second trimester  - Doing well.  - Attempted to obtain previous delivery records again.  - Discussed previous visit labs    3. Pregnancy with history of neonatal death  - Plans for primary Cesarean section. Will need MD visit in the third trimester.   Preterm labor symptoms and general obstetric precautions including but not limited to vaginal bleeding, contractions, leaking of fluid and fetal movement were reviewed in detail with the patient. Please refer to After Visit Summary for other counseling recommendations.  Return in about 4 weeks (around 08/03/2017).   Keyairra Kolinski, Harolyn RutherfordJennifer I, NP

## 2017-07-10 ENCOUNTER — Encounter (HOSPITAL_COMMUNITY): Payer: Self-pay

## 2017-07-10 ENCOUNTER — Inpatient Hospital Stay (HOSPITAL_COMMUNITY)
Admission: AD | Admit: 2017-07-10 | Discharge: 2017-07-10 | Disposition: A | Discharge status: Home / Self Care | Payer: Medicaid Other | Source: Ambulatory Visit | Attending: Obstetrics and Gynecology | Admitting: Obstetrics and Gynecology

## 2017-07-10 DIAGNOSIS — Z87891 Personal history of nicotine dependence: Secondary | ICD-10-CM | POA: Diagnosis not present

## 2017-07-10 DIAGNOSIS — M549 Dorsalgia, unspecified: Secondary | ICD-10-CM | POA: Diagnosis present

## 2017-07-10 DIAGNOSIS — Z3A23 23 weeks gestation of pregnancy: Secondary | ICD-10-CM | POA: Insufficient documentation

## 2017-07-10 DIAGNOSIS — O4702 False labor before 37 completed weeks of gestation, second trimester: Secondary | ICD-10-CM

## 2017-07-10 LAB — WET PREP, GENITAL
Clue Cells Wet Prep HPF POC: NONE SEEN
Sperm: NONE SEEN
Trich, Wet Prep: NONE SEEN
Yeast Wet Prep HPF POC: NONE SEEN

## 2017-07-10 LAB — URINALYSIS, ROUTINE W REFLEX MICROSCOPIC
Bilirubin Urine: NEGATIVE
Glucose, UA: NEGATIVE mg/dL
Hgb urine dipstick: NEGATIVE
Ketones, ur: NEGATIVE mg/dL
Leukocytes, UA: NEGATIVE
Nitrite: NEGATIVE
Protein, ur: NEGATIVE mg/dL
Specific Gravity, Urine: 1.006 (ref 1.005–1.030)
pH: 7 (ref 5.0–8.0)

## 2017-07-10 NOTE — Discharge Instructions (Signed)
Braxton Hicks Contractions °Contractions of the uterus can occur throughout pregnancy, but they are not always a sign that you are in labor. You may have practice contractions called Braxton Hicks contractions. These false labor contractions are sometimes confused with true labor. °What are Braxton Hicks contractions? °Braxton Hicks contractions are tightening movements that occur in the muscles of the uterus before labor. Unlike true labor contractions, these contractions do not result in opening (dilation) and thinning of the cervix. Toward the end of pregnancy (32-34 weeks), Braxton Hicks contractions can happen more often and may become stronger. These contractions are sometimes difficult to tell apart from true labor because they can be very uncomfortable. You should not feel embarrassed if you go to the hospital with false labor. °Sometimes, the only way to tell if you are in true labor is for your health care provider to look for changes in the cervix. The health care provider will do a physical exam and may monitor your contractions. If you are not in true labor, the exam should show that your cervix is not dilating and your water has not broken. °If there are no prenatal problems or other health problems associated with your pregnancy, it is completely safe for you to be sent home with false labor. You may continue to have Braxton Hicks contractions until you go into true labor. °How can I tell the difference between true labor and false labor? °· Differences °? False labor °? Contractions last 30-70 seconds.: Contractions are usually shorter and not as strong as true labor contractions. °? Contractions become very regular.: Contractions are usually irregular. °? Discomfort is usually felt in the top of the uterus, and it spreads to the lower abdomen and low back.: Contractions are often felt in the front of the lower abdomen and in the groin. °? Contractions do not go away with walking.: Contractions may  go away when you walk around or change positions while lying down. °? Contractions usually become more intense and increase in frequency.: Contractions get weaker and are shorter-lasting as time goes on. °? The cervix dilates and gets thinner.: The cervix usually does not dilate or become thin. °Follow these instructions at home: °· Take over-the-counter and prescription medicines only as told by your health care provider. °· Keep up with your usual exercises and follow other instructions from your health care provider. °· Eat and drink lightly if you think you are going into labor. °· If Braxton Hicks contractions are making you uncomfortable: °? Change your position from lying down or resting to walking, or change from walking to resting. °? Sit and rest in a tub of warm water. °? Drink enough fluid to keep your urine clear or pale yellow. Dehydration may cause these contractions. °? Do slow and deep breathing several times an hour. °· Keep all follow-up prenatal visits as told by your health care provider. This is important. °Contact a health care provider if: °· You have a fever. °· You have continuous pain in your abdomen. °Get help right away if: °· Your contractions become stronger, more regular, and closer together. °· You have fluid leaking or gushing from your vagina. °· You pass blood-tinged mucus (bloody show). °· You have bleeding from your vagina. °· You have low back pain that you never had before. °· You feel your baby’s head pushing down and causing pelvic pressure. °· Your baby is not moving inside you as much as it used to. °Summary °· Contractions that occur before labor are   called Braxton Hicks contractions, false labor, or practice contractions. °· Braxton Hicks contractions are usually shorter, weaker, farther apart, and less regular than true labor contractions. True labor contractions usually become progressively stronger and regular and they become more frequent. °· Manage discomfort from  Braxton Hicks contractions by changing position, resting in a warm bath, drinking plenty of water, or practicing deep breathing. °This information is not intended to replace advice given to you by your health care provider. Make sure you discuss any questions you have with your health care provider. °Document Released: 12/14/2005 Document Revised: 11/02/2016 Document Reviewed: 11/02/2016 °Elsevier Interactive Patient Education © 2017 Elsevier Inc. ° °Preterm Labor and Birth Information °Pregnancy normally lasts 39-41 weeks. Preterm labor is when labor starts early. It starts before you have been pregnant for 37 whole weeks. °What are the risk factors for preterm labor? °Preterm labor is more likely to occur in women who: °· Have an infection while pregnant. °· Have a cervix that is short. °· Have gone into preterm labor before. °· Have had surgery on their cervix. °· Are younger than age 17. °· Are older than age 35. °· Are African American. °· Are pregnant with two or more babies. °· Take street drugs while pregnant. °· Smoke while pregnant. °· Do not gain enough weight while pregnant. °· Got pregnant right after another pregnancy. ° °What are the symptoms of preterm labor? °Symptoms of preterm labor include: °· Cramps. The cramps may feel like the cramps some women get during their period. The cramps may happen with watery poop (diarrhea). °· Pain in the belly (abdomen). °· Pain in the lower back. °· Regular contractions or tightening. It may feel like your belly is getting tighter. °· Pressure in the lower belly that seems to get stronger. °· More fluid (discharge) leaking from the vagina. The fluid may be watery or bloody. °· Water breaking. ° °Why is it important to notice signs of preterm labor? °Babies who are born early may not be fully developed. They have a higher chance for: °· Long-term heart problems. °· Long-term lung problems. °· Trouble controlling body systems, like breathing. °· Bleeding in the  brain. °· A condition called cerebral palsy. °· Learning difficulties. °· Death. ° °These risks are highest for babies who are born before 34 weeks of pregnancy. °How is preterm labor treated? °Treatment depends on: °· How long you were pregnant. °· Your condition. °· The health of your baby. ° °Treatment may involve: °· Having a stitch (suture) placed in your cervix. When you give birth, your cervix opens so the baby can come out. The stitch keeps the cervix from opening too soon. °· Staying at the hospital. °· Taking or getting medicines, such as: °? Hormone medicines. °? Medicines to stop contractions. °? Medicines to help the baby’s lungs develop. °? Medicines to prevent your baby from having cerebral palsy. ° °What should I do if I am in preterm labor? °If you think you are going into labor too soon, call your doctor right away. °How can I prevent preterm labor? °· Do not use any tobacco products. °? Examples of these are cigarettes, chewing tobacco, and e-cigarettes. °? If you need help quitting, ask your doctor. °· Do not use street drugs. °· Do not use any medicines unless you ask your doctor if they are safe for you. °· Talk with your doctor before taking any herbal supplements. °· Make sure you gain enough weight. °· Watch for infection. If you think you   might have an infection, get it checked right away. °· If you have gone into preterm labor before, tell your doctor. °This information is not intended to replace advice given to you by your health care provider. Make sure you discuss any questions you have with your health care provider. °Document Released: 03/12/2009 Document Revised: 05/26/2016 Document Reviewed: 05/06/2016 °Elsevier Interactive Patient Education © 2018 Elsevier Inc. ° °

## 2017-07-10 NOTE — Progress Notes (Addendum)
G2P1L0 @ 23.[redacted] wksga. Presents to triage via EMS for back pain that started while at work around 3:15.. Denies lof or bleeding. + flutter.   Noted IV on left hand inserted by EMS.   Doppler: 152  Hx of fetal death during labor. State shoulder stuck.  Primary C/S scheduled for current pregnancy.   1652: EFM placed. Provider at bs assessing pt.   1727: Provider at bs for SVE, GC, and wet prep. FFN collected but ordered not to send. SVE closed.   Discharge instructions given with pt understanding. Pt left unit via ambulatory with SO.

## 2017-07-10 NOTE — MAU Provider Note (Signed)
History     CSN: [id number]  Arrival date and time: 07/10/17 1628   First Provider Initiated Contact with Patient 07/10/17 1652     Chief Complaint  Patient presents with  . Back Pain   HPI Cynthia Hull is a 26 y.o. G2P1000 at 6069w5d who presents via EMS for contractions. She states she was at work and felt like she was having contractions every 5-7 minutes that made her double over in pain. She rates the contractions a 7/10 and did not try anything for the pain. She denies any vaginal bleeding, leaking of fluid or abnormal discharge. She reports good fetal movement.   OB History    Gravida Para Term Preterm AB Living   2 1 1  0 0 0   SAB TAB Ectopic Multiple Live Births   0 0 0 0 1      Past Medical History:  Diagnosis Date  . Headache     Past Surgical History:  Procedure Laterality Date  . NO PAST SURGERIES      Family History  Problem Relation Age of Onset  . Hypertension Mother     Social History  Substance Use Topics  . Smoking status: Former Smoker    Years: 1.00    Types: Cigars    Quit date: 02/24/2017  . Smokeless tobacco: Never Used  . Alcohol use No    Allergies: No Known Allergies  Prescriptions Prior to Admission  Medication Sig Dispense Refill Last Dose  . acetaminophen (TYLENOL) 500 MG tablet Take 500-1,000 mg by mouth every 6 (six) hours as needed for mild pain, moderate pain or headache.    Taking  . metoCLOPramide (REGLAN) 10 MG tablet Take 1 tablet (10 mg total) by mouth 3 (three) times daily before meals. 90 tablet 1   . Prenatal Vit-Fe Fumarate-FA (PRENATAL MULTIVITAMIN) TABS tablet Take 1 tablet by mouth daily.   Taking    Review of Systems  Constitutional: Negative.  Negative for chills and fever.  HENT: Negative.   Respiratory: Negative.  Negative for shortness of breath.   Cardiovascular: Negative.  Negative for chest pain.  Gastrointestinal: Positive for abdominal pain. Negative for constipation, diarrhea, nausea and vomiting.   Genitourinary: Negative.  Negative for dysuria, vaginal bleeding and vaginal discharge.  Neurological: Negative.  Negative for dizziness and headaches.  Psychiatric/Behavioral: Negative.    Physical Exam   Height 5' (1.524 m), weight 163 lb (73.9 kg), last menstrual period 01/25/2017, unknown if currently breastfeeding.  Physical Exam  Nursing note and vitals reviewed. Constitutional: She is oriented to person, place, and time. She appears well-developed and well-nourished.  HENT:  Head: Normocephalic and atraumatic.  Eyes: Conjunctivae are normal. No scleral icterus.  Cardiovascular: Normal rate, regular rhythm and normal heart sounds.   Respiratory: Effort normal and breath sounds normal. No respiratory distress.  GI: Soft. She exhibits no distension. There is no tenderness.  Genitourinary: No vaginal discharge found.  Neurological: She is alert and oriented to person, place, and time.  Skin: Skin is warm and dry.  Psychiatric: She has a normal mood and affect. Her behavior is normal. Judgment and thought content normal.   Dilation: Closed Effacement (%): Thick Cervical Position: Posterior Station: Ballotable Exam by:: Ma Hillock. Neill SNM  Fetal Tracing:  Baseline: 135 Variability: moderate Accelerations: 10x10 Decelerations: none  Toco: uterine irritability   MAU Course  Procedures Results for orders placed or performed during the hospital encounter of 07/10/17 (from the past 24 hour(s))  Urinalysis, Routine w reflex  microscopic     Status: Abnormal   Collection Time: 07/10/17  4:34 PM  Result Value Ref Range   Color, Urine YELLOW YELLOW   APPearance HAZY (A) CLEAR   Specific Gravity, Urine 1.006 1.005 - 1.030   pH 7.0 5.0 - 8.0   Glucose, UA NEGATIVE NEGATIVE mg/dL   Hgb urine dipstick NEGATIVE NEGATIVE   Bilirubin Urine NEGATIVE NEGATIVE   Ketones, ur NEGATIVE NEGATIVE mg/dL   Protein, ur NEGATIVE NEGATIVE mg/dL   Nitrite NEGATIVE NEGATIVE   Leukocytes, UA  NEGATIVE NEGATIVE  Wet prep, genital     Status: Abnormal   Collection Time: 07/10/17  5:30 PM  Result Value Ref Range   Yeast Wet Prep HPF POC NONE SEEN NONE SEEN   Trich, Wet Prep NONE SEEN NONE SEEN   Clue Cells Wet Prep HPF POC NONE SEEN NONE SEEN   WBC, Wet Prep HPF POC FEW (A) NONE SEEN   Sperm NONE SEEN     MDM UA FHR reassuring for gestational age Patient reports resolution of pain upon arrival Assessment and Plan   1. Preterm uterine contractions in second trimester, antepartum    -Discharge patient home in stable contractions -Preterm labor precautions reviewed -Follow up at [facility] as scheduled for prenatal care -Encouraged to return here or to other Urgent Care/ED if she develops worsening of symptoms, increase in pain, fever, or other concerning symptoms.   Cleone Slim SNM 07/10/2017, 5:55 PM

## 2017-07-12 LAB — GC/CHLAMYDIA PROBE AMP (~~LOC~~) NOT AT ARMC
Chlamydia: NEGATIVE
Neisseria Gonorrhea: NEGATIVE

## 2017-07-20 ENCOUNTER — Other Ambulatory Visit: Payer: Self-pay | Admitting: Obstetrics and Gynecology

## 2017-07-20 ENCOUNTER — Ambulatory Visit (HOSPITAL_COMMUNITY)
Admission: RE | Admit: 2017-07-20 | Discharge: 2017-07-20 | Disposition: A | Discharge status: Home / Self Care | Payer: Medicaid Other | Source: Ambulatory Visit | Attending: Obstetrics and Gynecology | Admitting: Obstetrics and Gynecology

## 2017-07-20 DIAGNOSIS — Z362 Encounter for other antenatal screening follow-up: Secondary | ICD-10-CM | POA: Diagnosis not present

## 2017-07-20 DIAGNOSIS — O09892 Supervision of other high risk pregnancies, second trimester: Secondary | ICD-10-CM

## 2017-07-20 DIAGNOSIS — Z3A25 25 weeks gestation of pregnancy: Secondary | ICD-10-CM

## 2017-07-20 DIAGNOSIS — O09292 Supervision of pregnancy with other poor reproductive or obstetric history, second trimester: Secondary | ICD-10-CM | POA: Insufficient documentation

## 2017-07-20 DIAGNOSIS — O2612 Low weight gain in pregnancy, second trimester: Secondary | ICD-10-CM

## 2017-07-20 DIAGNOSIS — O09299 Supervision of pregnancy with other poor reproductive or obstetric history, unspecified trimester: Secondary | ICD-10-CM

## 2017-07-27 ENCOUNTER — Encounter: Payer: Self-pay | Admitting: General Practice

## 2017-08-04 ENCOUNTER — Encounter: Payer: Self-pay | Admitting: Obstetrics and Gynecology

## 2017-08-23 ENCOUNTER — Encounter: Payer: Self-pay | Admitting: General Practice

## 2017-08-23 ENCOUNTER — Ambulatory Visit (INDEPENDENT_AMBULATORY_CARE_PROVIDER_SITE_OTHER): Payer: PRIVATE HEALTH INSURANCE | Admitting: Medical

## 2017-08-23 DIAGNOSIS — Z3492 Encounter for supervision of normal pregnancy, unspecified, second trimester: Secondary | ICD-10-CM

## 2017-08-23 NOTE — Progress Notes (Signed)
Patient here today for Routine OB visit [redacted]w[redacted]d. Patient refused to let me weigh her or do her blood pressure, she is very upset that she had to wait .

## 2017-08-23 NOTE — Patient Instructions (Signed)
Fetal Movement Counts °Patient Name: ________________________________________________ Patient Due Date: ____________________ °What is a fetal movement count? °A fetal movement count is the number of times that you feel your baby move during a certain amount of time. This may also be called a fetal kick count. A fetal movement count is recommended for every pregnant woman. You may be asked to start counting fetal movements as early as week 28 of your pregnancy. °Pay attention to when your baby is most active. You may notice your baby's sleep and wake cycles. You may also notice things that make your baby move more. You should do a fetal movement count: °· When your baby is normally most active. °· At the same time each day. ° °A good time to count movements is while you are resting, after having something to eat and drink. °How do I count fetal movements? °1. Find a quiet, comfortable area. Sit, or lie down on your side. °2. Write down the date, the start time and stop time, and the number of movements that you felt between those two times. Take this information with you to your health care visits. °3. For 2 hours, count kicks, flutters, swishes, rolls, and jabs. You should feel at least 10 movements during 2 hours. °4. You may stop counting after you have felt 10 movements. °5. If you do not feel 10 movements in 2 hours, have something to eat and drink. Then, keep resting and counting for 1 hour. If you feel at least 4 movements during that hour, you may stop counting. °Contact a health care provider if: °· You feel fewer than 4 movements in 2 hours. °· Your baby is not moving like he or she usually does. °Date: ____________ Start time: ____________ Stop time: ____________ Movements: ____________ °Date: ____________ Start time: ____________ Stop time: ____________ Movements: ____________ °Date: ____________ Start time: ____________ Stop time: ____________ Movements: ____________ °Date: ____________ Start time:  ____________ Stop time: ____________ Movements: ____________ °Date: ____________ Start time: ____________ Stop time: ____________ Movements: ____________ °Date: ____________ Start time: ____________ Stop time: ____________ Movements: ____________ °Date: ____________ Start time: ____________ Stop time: ____________ Movements: ____________ °Date: ____________ Start time: ____________ Stop time: ____________ Movements: ____________ °Date: ____________ Start time: ____________ Stop time: ____________ Movements: ____________ °This information is not intended to replace advice given to you by your health care provider. Make sure you discuss any questions you have with your health care provider. °Document Released: 01/13/2007 Document Revised: 08/12/2016 Document Reviewed: 01/23/2016 °Elsevier Interactive Patient Education © 2018 Elsevier Inc. °Braxton Hicks Contractions °Contractions of the uterus can occur throughout pregnancy, but they are not always a sign that you are in labor. You may have practice contractions called Braxton Hicks contractions. These false labor contractions are sometimes confused with true labor. °What are Braxton Hicks contractions? °Braxton Hicks contractions are tightening movements that occur in the muscles of the uterus before labor. Unlike true labor contractions, these contractions do not result in opening (dilation) and thinning of the cervix. Toward the end of pregnancy (32-34 weeks), Braxton Hicks contractions can happen more often and may become stronger. These contractions are sometimes difficult to tell apart from true labor because they can be very uncomfortable. You should not feel embarrassed if you go to the hospital with false labor. °Sometimes, the only way to tell if you are in true labor is for your health care provider to look for changes in the cervix. The health care provider will do a physical exam and may monitor your contractions. If   you are not in true labor, the exam  should show that your cervix is not dilating and your water has not broken. °If there are no prenatal problems or other health problems associated with your pregnancy, it is completely safe for you to be sent home with false labor. You may continue to have Braxton Hicks contractions until you go into true labor. °How can I tell the difference between true labor and false labor? °· Differences °? False labor °? Contractions last 30-70 seconds.: Contractions are usually shorter and not as strong as true labor contractions. °? Contractions become very regular.: Contractions are usually irregular. °? Discomfort is usually felt in the top of the uterus, and it spreads to the lower abdomen and low back.: Contractions are often felt in the front of the lower abdomen and in the groin. °? Contractions do not go away with walking.: Contractions may go away when you walk around or change positions while lying down. °? Contractions usually become more intense and increase in frequency.: Contractions get weaker and are shorter-lasting as time goes on. °? The cervix dilates and gets thinner.: The cervix usually does not dilate or become thin. °Follow these instructions at home: °· Take over-the-counter and prescription medicines only as told by your health care provider. °· Keep up with your usual exercises and follow other instructions from your health care provider. °· Eat and drink lightly if you think you are going into labor. °· If Braxton Hicks contractions are making you uncomfortable: °? Change your position from lying down or resting to walking, or change from walking to resting. °? Sit and rest in a tub of warm water. °? Drink enough fluid to keep your urine clear or pale yellow. Dehydration may cause these contractions. °? Do slow and deep breathing several times an hour. °· Keep all follow-up prenatal visits as told by your health care provider. This is important. °Contact a health care provider if: °· You have a  fever. °· You have continuous pain in your abdomen. °Get help right away if: °· Your contractions become stronger, more regular, and closer together. °· You have fluid leaking or gushing from your vagina. °· You pass blood-tinged mucus (bloody show). °· You have bleeding from your vagina. °· You have low back pain that you never had before. °· You feel your baby’s head pushing down and causing pelvic pressure. °· Your baby is not moving inside you as much as it used to. °Summary °· Contractions that occur before labor are called Braxton Hicks contractions, false labor, or practice contractions. °· Braxton Hicks contractions are usually shorter, weaker, farther apart, and less regular than true labor contractions. True labor contractions usually become progressively stronger and regular and they become more frequent. °· Manage discomfort from Braxton Hicks contractions by changing position, resting in a warm bath, drinking plenty of water, or practicing deep breathing. °This information is not intended to replace advice given to you by your health care provider. Make sure you discuss any questions you have with your health care provider. °Document Released: 12/14/2005 Document Revised: 11/02/2016 Document Reviewed: 11/02/2016 °Elsevier Interactive Patient Education © 2017 Elsevier Inc. ° °

## 2017-08-23 NOTE — Progress Notes (Signed)
Patient presented for OB follow-up visit at 30 weeks. She was upset about the wait and refused to allow CMA to check weight or VS. She insisted that the provider check VS. I entered the room to see the patient within 10 minutes of her being put in the room. Patient was not in the room. Went to inform the administrative staff to ensure she is rescheduled and patient was at the window. She is very upset. She has been rescheduled for tomorrow morning.   Marny Lowenstein, PA-C 08/23/2017 3:32 PM

## 2017-08-24 ENCOUNTER — Encounter: Payer: Self-pay | Admitting: Certified Nurse Midwife

## 2017-08-24 ENCOUNTER — Encounter: Payer: Self-pay | Admitting: Medical

## 2017-08-26 ENCOUNTER — Ambulatory Visit (INDEPENDENT_AMBULATORY_CARE_PROVIDER_SITE_OTHER): Payer: Medicaid Other | Admitting: Student

## 2017-08-26 ENCOUNTER — Encounter (HOSPITAL_COMMUNITY): Payer: Self-pay

## 2017-08-26 DIAGNOSIS — Z23 Encounter for immunization: Secondary | ICD-10-CM | POA: Diagnosis not present

## 2017-08-26 DIAGNOSIS — Z3492 Encounter for supervision of normal pregnancy, unspecified, second trimester: Secondary | ICD-10-CM

## 2017-08-26 NOTE — Progress Notes (Signed)
   PRENATAL VISIT NOTE  Subjective:  Alfonse AlpersMaiye Nelda SevereWaller is a 26 y.o. G2P1000 at 641w3d being seen today for ongoing prenatal care.  She is currently monitored for the following issues for this high-risk pregnancy and has Abdominal pain affecting pregnancy; Supervision of low-risk pregnancy; Pregnancy with history of neonatal death; and Poor weight gain of pregnancy on her problem list.  Patient reports feelings of distress around upcoming birth and the fact that she is not with her partner anymore. Significant anxiety around the birth and death of her first child; eager to schedule c-section at 39 weeks. .  Contractions: Not present.  .  Movement: Present. Denies leaking of fluid.   The following portions of the patient's history were reviewed and updated as appropriate: allergies, current medications, past family history, past medical history, past social history, past surgical history and problem list. Problem list updated.  Objective:   Vitals:   08/26/17 0940  BP: (!) 110/51  Pulse: 79  Weight: 169 lb 1.6 oz (76.7 kg)    Fetal Status: Fetal Heart Rate (bpm): 143 Fundal Height: 30 cm Movement: Present     General:  Alert, oriented and cooperative. Patient is in no acute distress.  Skin: Skin is warm and dry. No rash noted.   Cardiovascular: Normal heart rate noted  Respiratory: Normal respiratory effort, no problems with respiration noted  Abdomen: Soft, gravid, appropriate for gestational age.  Pain/Pressure: Absent     Pelvic: Cervical exam deferred        Extremities: Normal range of motion.     Mental Status:  Normal mood and affect. Normal behavior. Normal judgment and thought content.   Assessment and Plan:  Pregnancy: G2P1000 at 251w3d  1. Encounter for supervision of low-risk pregnancy in second trimester  - Glucose Tolerance, 2 Hours w/1 Hour - HIV antibody - RPR - CBC 2. Pregnancy with history of neonatal death.  -Plan for primary C-section at 39 weeks; discussed with Dr.  Shawnie PonsPratt and Dr. Macon LargeAnyanwu.  -Patient happy with her care here; declines to see Asher MuirJamie LCSW at this time but knows that we are available to her at any point.  -[place]  Preterm labor symptoms and general obstetric precautions including but not limited to vaginal bleeding, contractions, leaking of fluid and fetal movement were reviewed in detail with the patient. Please refer to After Visit Summary for other counseling recommendations.  Return in about 2 weeks (around 09/09/2017).   Marylene LandKathryn Lorraine Kooistra, CNM

## 2017-08-26 NOTE — Patient Instructions (Signed)
Glucose Tolerance Test The glucose tolerance test (GTT) is one of several tests used to diagnose diabetes mellitus. The GTT is a blood test, and it may include a urine test as well. The GTT checks to see how your body processes sugar (glucose). For this test, you will consume a drink containing a high level of glucose. Your blood glucose levels will be checked before you consume the drink and then again 1, 2, 3, and possibly 4 hours after you consume it. Your health care provider may recommend that you have the GTT if you:  Have a family history of diabetes.  Are very overweight (obese).  Have experienced infections that keep coming back.  Have had numerous cuts or wounds that did not heal quickly, especially on your legs and feet.  Are a woman and have a history of giving birth to very large babies or a history of repeated fetal loss (stillbirth).  Have had glucose in your urine or high blood sugar: ? During pregnancy. ? After a heart attack, surgery, or prolonged periods of high stress.  The GTT lasts 3-4 hours. Other than the glucose solution, you will not be allowed to eat or drink anything during the test. You must remain at the testing location to make sure that your blood and urine samples are taken on time. How do I prepare for this test? Eat normally for 3 days prior to the GTT test, including having plenty of carbohydrate-rich foods. Do not eat or drink anything except water during the final 12 hours before the test. You should not smoke or exercise during the test. In addition, your health care provider may ask you to stop taking certain medicines before the test. What do the results mean? It is your responsibility to obtain your test results. Ask the lab or department performing the test when and how you will get your results. Contact your health care provider to discuss any questions you have about your results. Range of Normal Values Ranges for normal values may vary among  different labs and hospitals. You should always check with your health care provider after having lab work or other tests done to discuss whether your values are considered within normal limits. Normal levels of blood glucose are as follows:  Fasting: less than 110 mg/dL or less than 6.1 mmol/L (SI units).  1 hour after consuming the glucose drink: less than 200 mg/dL or less than 11.1 mmol/L.  2 hours after consuming the glucose drink: less than 140 mg/dL or less than 7.8 mmol/L.  3 hours after consuming the glucose drink: 70-115 mg/dL or less than 6.4 mmol/L.  4 hours after consuming the glucose drink: 70-115 mg/dL or less than 6.4 mmol/L.  The normal result for the urine test is negative, meaning that glucose is absent from your urine. Some substances can interfere with GTT results. These may include:  Blood pressure and heart failure medicines, including beta blockers, furosemide, and thiazides.  Anti-inflammatory medicines, including aspirin.  Nicotine.  Some psychiatric medicines.  Oral contraceptives.  Diuretics or corticosteroids.  Meaning of Results Outside Normal Value Ranges GTT test results that are above normal values may indicate health problems, such as:  Diabetes mellitus.  Acute stress response.  Cushing syndrome.  Tumors such as pheochromocytoma or glucagonoma.  Chronic renal failure.  Pancreatitis.  Hyperthyroidism.  Current infection.  Discuss your test results with your health care provider. He or she will use the results to make a diagnosis and determine a treatment plan  that is right for you. Talk with your health care provider to discuss your results, treatment options, and if necessary, the need for more tests. Talk with your health care provider if you have any questions about your results. This information is not intended to replace advice given to you by your health care provider. Make sure you discuss any questions you have with your  health care provider. Document Released: 01/06/2005 Document Revised: 08/18/2016 Document Reviewed: 04/20/2014 Elsevier Interactive Patient Education  2017 [employer]

## 2017-08-27 LAB — CBC
Hematocrit: 33.4 % — ABNORMAL LOW (ref 34.0–46.6)
Hemoglobin: 10.9 g/dL — ABNORMAL LOW (ref 11.1–15.9)
MCH: 30 pg (ref 26.6–33.0)
MCHC: 32.6 g/dL (ref 31.5–35.7)
MCV: 92 fL (ref 79–97)
Platelets: 296 10*3/uL (ref 150–379)
RBC: 3.63 x10E6/uL — ABNORMAL LOW (ref 3.77–5.28)
RDW: 14.8 % (ref 12.3–15.4)
WBC: 12.5 10*3/uL — ABNORMAL HIGH (ref 3.4–10.8)

## 2017-08-27 LAB — GLUCOSE TOLERANCE, 2 HOURS W/ 1HR
Glucose, 1 hour: 89 mg/dL (ref 65–179)
Glucose, 2 hour: 50 mg/dL — ABNORMAL LOW (ref 65–152)
Glucose, Fasting: 76 mg/dL (ref 65–91)

## 2017-08-27 LAB — HIV ANTIBODY (ROUTINE TESTING W REFLEX): HIV Screen 4th Generation wRfx: NONREACTIVE

## 2017-08-27 LAB — RPR: RPR Ser Ql: NONREACTIVE

## 2017-09-07 ENCOUNTER — Encounter: Payer: Self-pay | Admitting: *Deleted

## 2017-09-09 ENCOUNTER — Encounter: Payer: Self-pay | Admitting: Obstetrics & Gynecology

## 2017-09-28 ENCOUNTER — Ambulatory Visit (INDEPENDENT_AMBULATORY_CARE_PROVIDER_SITE_OTHER): Payer: Medicaid Other | Admitting: Obstetrics and Gynecology

## 2017-09-28 VITALS — BP 118/57 | HR 73 | Wt 171.4 lb

## 2017-09-28 DIAGNOSIS — O09299 Supervision of pregnancy with other poor reproductive or obstetric history, unspecified trimester: Secondary | ICD-10-CM

## 2017-09-28 DIAGNOSIS — Z3493 Encounter for supervision of normal pregnancy, unspecified, third trimester: Secondary | ICD-10-CM

## 2017-09-28 DIAGNOSIS — O09293 Supervision of pregnancy with other poor reproductive or obstetric history, third trimester: Secondary | ICD-10-CM

## 2017-09-28 NOTE — Patient Instructions (Signed)
Baby & Me  Discuss newborn & infant parenting and family adjustment issues with other new mothers in a relaxed environment. Each week brings a new speaker or baby-centered activity. We encourage mothers to join [name] every Thursday in the [facility] at 11:00am. Babies birth until crawling. No registration or fee.

## 2017-09-28 NOTE — Progress Notes (Signed)
   PRENATAL VISIT NOTE  Subjective:  Cynthia Hull is a 26 y.o. G2P1000 at [redacted]w[redacted]d being seen today for ongoing prenatal care.  She is currently monitored for the following issues for this low-risk pregnancy and has Abdominal pain affecting pregnancy; Supervision of low-risk pregnancy; Pregnancy with history of neonatal death; and Poor weight gain of pregnancy on her problem list.  Patient reports no complaints.  Contractions: Irregular. Vag. Bleeding: None.  Movement: Present. Denies leaking of fluid.   The following portions of the patient's history were reviewed and updated as appropriate: allergies, current medications, past family history, past medical history, past social history, past surgical history and problem list. Problem list updated.  Objective:   Vitals:   09/28/17 0751  BP: (!) 118/57  Pulse: 73  Weight: 171 lb 6.4 oz (77.7 kg)    Fetal Status: Fetal Heart Rate (bpm): 140 Fundal Height: 35 cm Movement: Present     General:  Alert, oriented and cooperative. Patient is in no acute distress.  Skin: Skin is warm and dry. No rash noted.   Cardiovascular: Normal heart rate noted  Respiratory: Normal respiratory effort, no problems with respiration noted  Abdomen: Soft, gravid, appropriate for gestational age.  Pain/Pressure: Absent     Pelvic: Cervical exam deferred        Extremities: Normal range of motion.  Edema: None  Mental Status:  Normal mood and affect. Normal behavior. Normal judgment and thought content.   Assessment and Plan:  Pregnancy: G2P1000 at [redacted]w[redacted]d  1. Encounter for supervision of low-risk pregnancy in third trimester  - GBS at next visit.   2. Pregnancy with history of neonatal death  Very nervous about upcoming cesarean section. Thinks about delivery of her first son and is scared about going through that again. States she would like to meet MD who is doing her C-section.   Preterm labor symptoms and general obstetric precautions including but not  limited to vaginal bleeding, contractions, leaking of fluid and fetal movement were reviewed in detail with the patient. Please refer to After Visit Summary for other counseling recommendations.  Return in about 1 week (around 10/05/2017) for Please schedule with Dr. Penne Lash on 10/8. She is doing her C-section .   Venia Carbon, NP

## 2017-10-04 ENCOUNTER — Other Ambulatory Visit (HOSPITAL_COMMUNITY)
Admission: RE | Admit: 2017-10-04 | Discharge: 2017-10-04 | Disposition: A | Discharge status: Home / Self Care | Payer: Medicaid Other | Source: Ambulatory Visit | Attending: Obstetrics & Gynecology | Admitting: Obstetrics & Gynecology

## 2017-10-04 ENCOUNTER — Ambulatory Visit (INDEPENDENT_AMBULATORY_CARE_PROVIDER_SITE_OTHER): Payer: Medicaid Other | Admitting: Obstetrics & Gynecology

## 2017-10-04 VITALS — BP 108/54 | HR 86 | Wt 170.9 lb

## 2017-10-04 DIAGNOSIS — Z3493 Encounter for supervision of normal pregnancy, unspecified, third trimester: Secondary | ICD-10-CM

## 2017-10-04 DIAGNOSIS — R87612 Low grade squamous intraepithelial lesion on cytologic smear of cervix (LGSIL): Secondary | ICD-10-CM

## 2017-10-04 DIAGNOSIS — Z113 Encounter for screening for infections with a predominantly sexual mode of transmission: Secondary | ICD-10-CM

## 2017-10-04 NOTE — Progress Notes (Signed)
Educated pt on Good Latch and Skin to Skin     PRENATAL VISIT NOTE  Subjective:  Cynthia Hull is a 26 y.o. G2P1000 at [redacted]w[redacted]d being seen today for ongoing prenatal care.  She is currently monitored for the following issues for this low-risk pregnancy and has Abdominal pain affecting pregnancy; Supervision of low-risk pregnancy; Pregnancy with history of neonatal death; Poor weight gain of pregnancy; and Low grade squamous intraepithelial lesion (LGSIL) on Papanicolaou smear of cervix on her problem list.  Patient reports no complaints.  Contractions: Irritability. Vag. Bleeding: None.  Movement: Present. Denies leaking of fluid.   The following portions of the patient's history were reviewed and updated as appropriate: allergies, current medications, past family history, past medical history, past social history, past surgical history and problem list. Problem list updated.  Objective:   Vitals:   10/04/17 0920  BP: (!) 108/54  Pulse: 86  Weight: 170 lb 14.4 oz (77.5 kg)    Fetal Status: Fetal Heart Rate (bpm): 142 Fundal Height: 37 cm Movement: Present     General:  Alert, oriented and cooperative. Patient is in no acute distress.  Skin: Skin is warm and dry. No rash noted.   Cardiovascular: Normal heart rate noted  Respiratory: Normal respiratory effort, no problems with respiration noted  Abdomen: Soft, gravid, appropriate for gestational age.  Pain/Pressure: Present     Pelvic: Cervical exam performed      1.5/50/-3 vertex, posterior  Extremities: Normal range of motion.  Edema: None  Mental Status:  Normal mood and affect. Normal behavior. Normal judgment and thought content.   Assessment and Plan:  Pregnancy: G2P1000 at [redacted]w[redacted]d  1. Encounter for supervision of low-risk pregnancy in third trimester - Culture, beta strep (group b only) - GC/Chlamydia probe amp ([place])not at [facility]  2. Low grade squamous intraepithelial lesion (LGSIL) on Papanicolaou smear of cervix -Need  to find last pap smear.  Rn Louellen Molder to help find.  3.  Consent for c/s The risks of cesarean section discussed with the patient included but were not limited to: bleeding which may require transfusion or reoperation; infection which may require antibiotics; injury to bowel, bladder, ureters or other surrounding organs; injury to the fetus; need for additional procedures including hysterectomy in the event of a life-threatening hemorrhage; placental abnormalities wth subsequent pregnancies, incisional problems, thromboembolic phenomenon and other postoperative/anesthesia complications. The patient concurred with the proposed plan, giving informed written consent for the procedure.   Term labor symptoms and general obstetric precautions including but not limited to vaginal bleeding, contractions, leaking of fluid and fetal movement were reviewed in detail with the patient. Please refer to After Visit Summary for other counseling recommendations.  Return in about 1 week (around 10/11/2017).   Elsie Lincoln, MD

## 2017-10-05 LAB — GC/CHLAMYDIA PROBE AMP (~~LOC~~) NOT AT ARMC
Chlamydia: NEGATIVE
Neisseria Gonorrhea: NEGATIVE

## 2017-10-07 LAB — CULTURE, BETA STREP (GROUP B ONLY): Strep Gp B Culture: POSITIVE — AB

## 2017-10-08 ENCOUNTER — Encounter (HOSPITAL_COMMUNITY): Payer: Self-pay

## 2017-10-11 ENCOUNTER — Encounter: Payer: Self-pay | Admitting: Advanced Practice Midwife

## 2017-10-12 ENCOUNTER — Ambulatory Visit (INDEPENDENT_AMBULATORY_CARE_PROVIDER_SITE_OTHER): Payer: Medicaid Other | Admitting: Family Medicine

## 2017-10-12 VITALS — BP 108/57 | HR 97 | Wt 172.9 lb

## 2017-10-12 DIAGNOSIS — O09299 Supervision of pregnancy with other poor reproductive or obstetric history, unspecified trimester: Secondary | ICD-10-CM

## 2017-10-12 DIAGNOSIS — O99820 Streptococcus B carrier state complicating pregnancy: Secondary | ICD-10-CM

## 2017-10-12 DIAGNOSIS — Z3493 Encounter for supervision of normal pregnancy, unspecified, third trimester: Secondary | ICD-10-CM

## 2017-10-12 NOTE — Progress Notes (Signed)
   PRENATAL VISIT NOTE  Subjective:  Cynthia Hull is a 26 y.o. G2P1000 at [redacted]w[redacted]d being seen today for ongoing prenatal care.  She is currently monitored for the following issues for this low-risk pregnancy and has Supervision of low-risk pregnancy; Pregnancy with history of neonatal death; Poor weight gain of pregnancy; and Low grade squamous intraepithelial lesion (LGSIL) on Papanicolaou smear of cervix on her problem list.  Patient reports no complaints.  Contractions: Irritability. Vag. Bleeding: None.  Movement: Present. Denies leaking of fluid.   The following portions of the patient's history were reviewed and updated as appropriate: allergies, current medications, past family history, past medical history, past social history, past surgical history and problem list. Problem list updated.  Objective:   Vitals:   10/12/17 1345  BP: (!) 108/57  Pulse: 97  Weight: 172 lb 14.4 oz (78.4 kg)    Fetal Status: Fetal Heart Rate (bpm): 148   Movement: Present     General:  Alert, oriented and cooperative. Patient is in no acute distress.  Skin: Skin is warm and dry. No rash noted.   Cardiovascular: Normal heart rate noted  Respiratory: Normal respiratory effort, no problems with respiration noted  Abdomen: Soft, gravid, appropriate for gestational age.  Pain/Pressure: Present     Pelvic: Cervical exam deferred        Extremities: Normal range of motion.  Edema: None  Mental Status:  Normal mood and affect. Normal behavior. Normal judgment and thought content.   Assessment and Plan:  Pregnancy: G2P1000 at [redacted]w[redacted]d  1. Encounter for supervision of low-risk pregnancy in third trimester - Reviewed cultures from last visit, GBS +  2. Pregnancy with history of neonatal death - Has planned primary C/S for 10/25/17 with Dr. Penne Lash, who discussed risk and benefits with patient last visit  Term labor symptoms and general obstetric precautions including but not limited to vaginal bleeding,  contractions, leaking of fluid and fetal movement were reviewed in detail with the patient. Please refer to After Visit Summary for other counseling recommendations.  Return in about 1 week (around 10/19/2017) for LOB.   Frederik Pear, MD   Future Appointments Date Time Provider Department Center  10/22/2017 8:20 AM Levie Heritage, DO [facility]  10/22/2017 9:45 AM [facility] PAT 5 [facility] None

## 2017-10-22 ENCOUNTER — Encounter (HOSPITAL_COMMUNITY)
Admission: AD | Disposition: A | Discharge status: Home / Self Care | Payer: Self-pay | Source: Ambulatory Visit | Attending: Obstetrics and Gynecology

## 2017-10-22 ENCOUNTER — Encounter: Payer: Self-pay | Admitting: Family Medicine

## 2017-10-22 ENCOUNTER — Encounter (HOSPITAL_COMMUNITY): Payer: Self-pay | Admitting: *Deleted

## 2017-10-22 ENCOUNTER — Inpatient Hospital Stay (HOSPITAL_COMMUNITY): Payer: Medicaid Other | Admitting: Anesthesiology

## 2017-10-22 ENCOUNTER — Encounter (HOSPITAL_COMMUNITY)
Admission: RE | Admit: 2017-10-22 | Discharge: 2017-10-22 | Disposition: A | Discharge status: Home / Self Care | Payer: Medicaid Other | Source: Ambulatory Visit | Attending: Obstetrics & Gynecology | Admitting: Obstetrics & Gynecology

## 2017-10-22 ENCOUNTER — Inpatient Hospital Stay (HOSPITAL_COMMUNITY)
Admission: AD | Admit: 2017-10-22 | Discharge: 2017-10-24 | DRG: 788 | Disposition: A | Discharge status: Home / Self Care | Payer: Medicaid Other | Source: Ambulatory Visit | Attending: Obstetrics and Gynecology | Admitting: Obstetrics and Gynecology

## 2017-10-22 DIAGNOSIS — O9081 Anemia of the puerperium: Secondary | ICD-10-CM | POA: Diagnosis not present

## 2017-10-22 DIAGNOSIS — O0990 Supervision of high risk pregnancy, unspecified, unspecified trimester: Secondary | ICD-10-CM

## 2017-10-22 DIAGNOSIS — O09299 Supervision of pregnancy with other poor reproductive or obstetric history, unspecified trimester: Secondary | ICD-10-CM

## 2017-10-22 DIAGNOSIS — Z3493 Encounter for supervision of normal pregnancy, unspecified, third trimester: Secondary | ICD-10-CM

## 2017-10-22 DIAGNOSIS — Z87891 Personal history of nicotine dependence: Secondary | ICD-10-CM | POA: Diagnosis not present

## 2017-10-22 DIAGNOSIS — Z3A38 38 weeks gestation of pregnancy: Secondary | ICD-10-CM

## 2017-10-22 DIAGNOSIS — O99824 Streptococcus B carrier state complicating childbirth: Secondary | ICD-10-CM | POA: Diagnosis present

## 2017-10-22 DIAGNOSIS — O2613 Low weight gain in pregnancy, third trimester: Secondary | ICD-10-CM

## 2017-10-22 DIAGNOSIS — O26893 Other specified pregnancy related conditions, third trimester: Principal | ICD-10-CM | POA: Diagnosis present

## 2017-10-22 LAB — CBC
HCT: 33.1 % — ABNORMAL LOW (ref 36.0–46.0)
Hemoglobin: 11.3 g/dL — ABNORMAL LOW (ref 12.0–15.0)
MCH: 30.3 pg (ref 26.0–34.0)
MCHC: 34.1 g/dL (ref 30.0–36.0)
MCV: 88.7 fL (ref 78.0–100.0)
Platelets: 294 10*3/uL (ref 150–400)
RBC: 3.73 MIL/uL — ABNORMAL LOW (ref 3.87–5.11)
RDW: 13.8 % (ref 11.5–15.5)
WBC: 12.6 10*3/uL — ABNORMAL HIGH (ref 4.0–10.5)

## 2017-10-22 LAB — TYPE AND SCREEN
ABO/RH(D): O POS
Antibody Screen: NEGATIVE

## 2017-10-22 SURGERY — Surgical Case
Anesthesia: Regional | Wound class: Clean Contaminated

## 2017-10-22 MED ORDER — SIMETHICONE 80 MG PO CHEW
80.0000 mg | CHEWABLE_TABLET | ORAL | Status: DC
  Administered 2017-10-22 – 2017-10-24 (×2): 80 mg via ORAL
  Filled 2017-10-22: qty 1

## 2017-10-22 MED ORDER — DEXTROSE 5 % IV SOLN
500.0000 mg | Freq: Once | INTRAVENOUS | Status: AC
  Administered 2017-10-22 (×2): 500 mg via INTRAVENOUS
  Filled 2017-10-22: qty 500

## 2017-10-22 MED ORDER — OXYCODONE HCL 5 MG PO TABS
5.0000 mg | ORAL_TABLET | ORAL | Status: DC | PRN
  Administered 2017-10-23 (×5): 5 mg via ORAL
  Filled 2017-10-22 (×5): qty 1

## 2017-10-22 MED ORDER — PRENATAL MULTIVITAMIN CH
1.0000 | ORAL_TABLET | Freq: Every day | ORAL | Status: DC
  Administered 2017-10-23 – 2017-10-24 (×2): 1 via ORAL
  Filled 2017-10-22 (×2): qty 1

## 2017-10-22 MED ORDER — DIPHENHYDRAMINE HCL 50 MG/ML IJ SOLN: 12.5000 mg | INTRAMUSCULAR | Status: DC | PRN

## 2017-10-22 MED ORDER — NALOXONE HCL 0.4 MG/ML IJ SOLN: 0.4000 mg | INTRAMUSCULAR | Status: DC | PRN

## 2017-10-22 MED ORDER — DIPHENHYDRAMINE HCL 25 MG PO CAPS: 25.0000 mg | ORAL_CAPSULE | ORAL | Status: DC | PRN

## 2017-10-22 MED ORDER — MEPERIDINE HCL 25 MG/ML IJ SOLN: 6.2500 mg | INTRAMUSCULAR | Status: DC | PRN

## 2017-10-22 MED ORDER — NALBUPHINE HCL 10 MG/ML IJ SOLN: 5.0000 mg | Freq: Once | INTRAMUSCULAR | Status: DC | PRN

## 2017-10-22 MED ORDER — DIPHENHYDRAMINE HCL 50 MG/ML IJ SOLN
INTRAMUSCULAR | Status: DC | PRN
  Administered 2017-10-22: 25 mg via INTRAVENOUS

## 2017-10-22 MED ORDER — OXYTOCIN 40 UNITS IN LACTATED RINGERS INFUSION - SIMPLE MED: 2.5000 [IU]/h | INTRAVENOUS | Status: DC

## 2017-10-22 MED ORDER — SENNOSIDES-DOCUSATE SODIUM 8.6-50 MG PO TABS
2.0000 | ORAL_TABLET | ORAL | Status: DC
  Administered 2017-10-22 – 2017-10-24 (×2): 2 via ORAL
  Filled 2017-10-22: qty 2

## 2017-10-22 MED ORDER — KETOROLAC TROMETHAMINE 30 MG/ML IJ SOLN
INTRAMUSCULAR | Status: AC
Start: 2017-10-22 — End: 2017-10-22
  Administered 2017-10-22: 30 mg via INTRAVENOUS
  Filled 2017-10-22: qty 1

## 2017-10-22 MED ORDER — HYDROMORPHONE HCL 1 MG/ML IJ SOLN
0.2500 mg | INTRAMUSCULAR | Status: DC | PRN
Start: 2017-10-22 — End: 2017-10-22

## 2017-10-22 MED ORDER — FENTANYL CITRATE (PF) 100 MCG/2ML IJ SOLN: 25.0000 ug | INTRAMUSCULAR | Status: DC | PRN

## 2017-10-22 MED ORDER — SIMETHICONE 80 MG PO CHEW: 80.0000 mg | CHEWABLE_TABLET | ORAL | Status: DC | PRN

## 2017-10-22 MED ORDER — IBUPROFEN 600 MG PO TABS: 600.0000 mg | ORAL_TABLET | Freq: Four times a day (QID) | ORAL | Status: DC

## 2017-10-22 MED ORDER — LACTATED RINGERS IV SOLN: INTRAVENOUS | Status: DC

## 2017-10-22 MED ORDER — NALOXONE HCL 2 MG/2ML IJ SOSY
1.0000 ug/kg/h | PREFILLED_SYRINGE | INTRAMUSCULAR | Status: DC | PRN
  Filled 2017-10-22: qty 2

## 2017-10-22 MED ORDER — OXYTOCIN 40 UNITS IN LACTATED RINGERS INFUSION - SIMPLE MED: 2.5000 [IU]/h | INTRAVENOUS | Status: AC

## 2017-10-22 MED ORDER — KETOROLAC TROMETHAMINE 30 MG/ML IJ SOLN
30.0000 mg | Freq: Once | INTRAMUSCULAR | Status: DC | PRN
  Administered 2017-10-22: 30 mg via INTRAVENOUS

## 2017-10-22 MED ORDER — NALBUPHINE HCL 10 MG/ML IJ SOLN: 5.0000 mg | INTRAMUSCULAR | Status: DC | PRN

## 2017-10-22 MED ORDER — OXYTOCIN BOLUS FROM INFUSION: 500.0000 mL | Freq: Once | INTRAVENOUS | Status: DC

## 2017-10-22 MED ORDER — LIDOCAINE HCL (PF) 1 % IJ SOLN
30.0000 mL | INTRAMUSCULAR | Status: DC | PRN
  Filled 2017-10-22: qty 30

## 2017-10-22 MED ORDER — PHENYLEPHRINE 8 MG IN D5W 100 ML (0.08MG/ML) PREMIX OPTIME
INJECTION | INTRAVENOUS | Status: DC | PRN
  Administered 2017-10-22: 80 ug/min via INTRAVENOUS

## 2017-10-22 MED ORDER — ZOLPIDEM TARTRATE 5 MG PO TABS: 5.0000 mg | ORAL_TABLET | Freq: Every evening | ORAL | Status: DC | PRN

## 2017-10-22 MED ORDER — ONDANSETRON HCL 4 MG/2ML IJ SOLN: 4.0000 mg | Freq: Three times a day (TID) | INTRAMUSCULAR | Status: DC | PRN

## 2017-10-22 MED ORDER — SODIUM CHLORIDE 0.9 % IV SOLN
2.0000 g | Freq: Once | INTRAVENOUS | Status: AC
  Administered 2017-10-22: 2 g via INTRAVENOUS
  Filled 2017-10-22: qty 2000

## 2017-10-22 MED ORDER — SODIUM CHLORIDE 0.9% FLUSH: 3.0000 mL | INTRAVENOUS | Status: DC | PRN

## 2017-10-22 MED ORDER — WITCH HAZEL-GLYCERIN EX PADS: 1.0000 "application " | MEDICATED_PAD | CUTANEOUS | Status: DC | PRN

## 2017-10-22 MED ORDER — OXYCODONE HCL 5 MG/5ML PO SOLN: 5.0000 mg | Freq: Once | ORAL | Status: DC | PRN

## 2017-10-22 MED ORDER — OXYTOCIN 10 UNIT/ML IJ SOLN
INTRAVENOUS | Status: DC | PRN
  Administered 2017-10-22: 40 [IU] via INTRAVENOUS

## 2017-10-22 MED ORDER — MENTHOL 3 MG MT LOZG: 1.0000 | LOZENGE | OROMUCOSAL | Status: DC | PRN

## 2017-10-22 MED ORDER — FENTANYL CITRATE (PF) 100 MCG/2ML IJ SOLN
INTRAMUSCULAR | Status: DC | PRN
  Administered 2017-10-22: 10 ug via INTRAVENOUS

## 2017-10-22 MED ORDER — PROMETHAZINE HCL 25 MG/ML IJ SOLN: 6.2500 mg | INTRAMUSCULAR | Status: DC | PRN

## 2017-10-22 MED ORDER — OXYCODONE HCL 5 MG PO TABS
10.0000 mg | ORAL_TABLET | ORAL | Status: DC | PRN
  Administered 2017-10-24: 10 mg via ORAL
  Filled 2017-10-22: qty 2

## 2017-10-22 MED ORDER — LACTATED RINGERS IV SOLN
INTRAVENOUS | Status: DC | PRN
  Administered 2017-10-22: 11:00:00 via INTRAVENOUS

## 2017-10-22 MED ORDER — LACTATED RINGERS IV SOLN
500.0000 mL | INTRAVENOUS | Status: DC | PRN
  Administered 2017-10-22: 1000 mL via INTRAVENOUS

## 2017-10-22 MED ORDER — ACETAMINOPHEN 325 MG PO TABS
650.0000 mg | ORAL_TABLET | ORAL | Status: DC | PRN
  Administered 2017-10-24: 650 mg via ORAL
  Filled 2017-10-22: qty 2

## 2017-10-22 MED ORDER — DEXAMETHASONE SODIUM PHOSPHATE 10 MG/ML IJ SOLN
INTRAMUSCULAR | Status: DC | PRN
  Administered 2017-10-22: 4 mg via INTRAVENOUS

## 2017-10-22 MED ORDER — CEFAZOLIN SODIUM-DEXTROSE 2-4 GM/100ML-% IV SOLN: 2.0000 g | INTRAVENOUS | Status: DC

## 2017-10-22 MED ORDER — SIMETHICONE 80 MG PO CHEW
80.0000 mg | CHEWABLE_TABLET | Freq: Three times a day (TID) | ORAL | Status: DC
  Administered 2017-10-22 – 2017-10-24 (×4): 80 mg via ORAL
  Filled 2017-10-22 (×4): qty 1

## 2017-10-22 MED ORDER — MORPHINE SULFATE (PF) 0.5 MG/ML IJ SOLN
INTRAMUSCULAR | Status: DC | PRN
  Administered 2017-10-22: .2 mg via EPIDURAL

## 2017-10-22 MED ORDER — DIBUCAINE 1 % RE OINT: 1.0000 "application " | TOPICAL_OINTMENT | RECTAL | Status: DC | PRN

## 2017-10-22 MED ORDER — SCOPOLAMINE 1 MG/3DAYS TD PT72: 1.0000 | MEDICATED_PATCH | Freq: Once | TRANSDERMAL | Status: DC

## 2017-10-22 MED ORDER — PHENYLEPHRINE HCL 10 MG/ML IJ SOLN
INTRAMUSCULAR | Status: DC | PRN
  Administered 2017-10-22: 40 ug via INTRAVENOUS

## 2017-10-22 MED ORDER — SCOPOLAMINE 1 MG/3DAYS TD PT72
1.0000 | MEDICATED_PATCH | Freq: Once | TRANSDERMAL | Status: DC
  Filled 2017-10-22: qty 1

## 2017-10-22 MED ORDER — COCONUT OIL OIL
1.0000 "application " | TOPICAL_OIL | Status: DC | PRN
  Administered 2017-10-23: 1 via TOPICAL
  Filled 2017-10-22: qty 120

## 2017-10-22 MED ORDER — LACTATED RINGERS IV SOLN
INTRAVENOUS | Status: DC
  Administered 2017-10-22 (×2): via INTRAVENOUS

## 2017-10-22 MED ORDER — NALBUPHINE HCL 10 MG/ML IJ SOLN
5.0000 mg | Freq: Once | INTRAMUSCULAR | Status: DC | PRN
Start: 2017-10-22 — End: 2017-10-24

## 2017-10-22 MED ORDER — DIPHENHYDRAMINE HCL 25 MG PO CAPS: 25.0000 mg | ORAL_CAPSULE | Freq: Four times a day (QID) | ORAL | Status: DC | PRN

## 2017-10-22 MED ORDER — ONDANSETRON HCL 4 MG/2ML IJ SOLN
4.0000 mg | Freq: Four times a day (QID) | INTRAMUSCULAR | Status: DC | PRN
  Administered 2017-10-22: 4 mg via INTRAVENOUS

## 2017-10-22 MED ORDER — NALBUPHINE HCL 10 MG/ML IJ SOLN
5.0000 mg | INTRAMUSCULAR | Status: DC | PRN
Start: 2017-10-22 — End: 2017-10-22

## 2017-10-22 MED ORDER — BUPIVACAINE IN DEXTROSE 0.75-8.25 % IT SOLN
INTRATHECAL | Status: DC | PRN
  Administered 2017-10-22: 1.5 mL via INTRATHECAL

## 2017-10-22 MED ORDER — NALOXONE HCL 2 MG/2ML IJ SOSY
1.0000 ug/kg/h | PREFILLED_SYRINGE | INTRAVENOUS | Status: DC | PRN
  Filled 2017-10-22: qty 2

## 2017-10-22 MED ORDER — TERBUTALINE SULFATE 1 MG/ML IJ SOLN
0.2500 mg | Freq: Once | INTRAMUSCULAR | Status: AC
  Administered 2017-10-22: 0.25 mg via SUBCUTANEOUS

## 2017-10-22 MED ORDER — KETOROLAC TROMETHAMINE 30 MG/ML IJ SOLN
30.0000 mg | Freq: Four times a day (QID) | INTRAMUSCULAR | Status: DC
  Administered 2017-10-22 – 2017-10-23 (×3): 30 mg via INTRAVENOUS
  Filled 2017-10-22 (×3): qty 1

## 2017-10-22 MED ORDER — SCOPOLAMINE 1 MG/3DAYS TD PT72
MEDICATED_PATCH | TRANSDERMAL | Status: DC | PRN
  Administered 2017-10-22: 1 via TRANSDERMAL

## 2017-10-22 MED ORDER — IBUPROFEN 600 MG PO TABS
600.0000 mg | ORAL_TABLET | Freq: Four times a day (QID) | ORAL | Status: DC
  Filled 2017-10-22 (×4): qty 1

## 2017-10-22 MED ORDER — FAMOTIDINE IN NACL 20-0.9 MG/50ML-% IV SOLN
INTRAVENOUS | Status: AC
  Administered 2017-10-22: 20 mg
  Filled 2017-10-22: qty 50

## 2017-10-22 MED ORDER — TERBUTALINE SULFATE 1 MG/ML IJ SOLN
INTRAMUSCULAR | Status: AC
  Filled 2017-10-22: qty 1

## 2017-10-22 MED ORDER — OXYCODONE HCL 5 MG PO TABS: 5.0000 mg | ORAL_TABLET | Freq: Once | ORAL | Status: DC | PRN

## 2017-10-22 MED ORDER — SODIUM CHLORIDE 0.9 % IR SOLN
Status: DC | PRN
  Administered 2017-10-22: 1

## 2017-10-22 MED ORDER — LACTATED RINGERS IV SOLN
INTRAVENOUS | Status: DC
  Administered 2017-10-22: 19:00:00 via INTRAVENOUS

## 2017-10-22 MED ORDER — ACETAMINOPHEN 325 MG PO TABS: 650.0000 mg | ORAL_TABLET | ORAL | Status: DC | PRN

## 2017-10-22 MED ORDER — SOD CITRATE-CITRIC ACID 500-334 MG/5ML PO SOLN: 30.0000 mL | ORAL | Status: DC | PRN

## 2017-10-22 MED ORDER — SOD CITRATE-CITRIC ACID 500-334 MG/5ML PO SOLN
30.0000 mL | ORAL | Status: AC
  Administered 2017-10-22: 30 mL via ORAL
  Filled 2017-10-22: qty 15

## 2017-10-22 MED ORDER — TETANUS-DIPHTH-ACELL PERTUSSIS 5-2.5-18.5 LF-MCG/0.5 IM SUSP: 0.5000 mL | Freq: Once | INTRAMUSCULAR | Status: DC

## 2017-10-22 SURGICAL SUPPLY — 29 items
BENZOIN TINCTURE PRP APPL 2/3 (GAUZE/BANDAGES/DRESSINGS) ×2 IMPLANT
CHLORAPREP W/TINT 26ML (MISCELLANEOUS) ×2 IMPLANT
CLAMP CORD UMBIL (MISCELLANEOUS) IMPLANT
CLOTH BEACON ORANGE TIMEOUT ST (SAFETY) ×2 IMPLANT
DRAIN JACKSON PRT FLT 7MM (DRAIN) IMPLANT
DRSG OPSITE POSTOP 4X10 (GAUZE/BANDAGES/DRESSINGS) ×2 IMPLANT
ELECT REM PT RETURN 9FT ADLT (ELECTROSURGICAL) ×2
ELECTRODE REM PT RTRN 9FT ADLT (ELECTROSURGICAL) ×1 IMPLANT
EVACUATOR SILICONE 100CC (DRAIN) IMPLANT
EXTRACTOR VACUUM M CUP 4 TUBE (SUCTIONS) IMPLANT
GLOVE BIO SURGEON STRL SZ7 (GLOVE) ×2 IMPLANT
GLOVE BIOGEL PI IND STRL 7.0 (GLOVE) ×2 IMPLANT
GLOVE BIOGEL PI INDICATOR 7.0 (GLOVE) ×2
GOWN STRL REUS W/TWL LRG LVL3 (GOWN DISPOSABLE) ×4 IMPLANT
KIT ABG SYR 3ML LUER SLIP (SYRINGE) IMPLANT
NEEDLE HYPO 25X5/8 SAFETYGLIDE (NEEDLE) ×2 IMPLANT
NS IRRIG 1000ML POUR BTL (IV SOLUTION) ×2 IMPLANT
PACK C SECTION WH (CUSTOM PROCEDURE TRAY) ×2 IMPLANT
PAD OB MATERNITY 4.3X12.25 (PERSONAL CARE ITEMS) ×2 IMPLANT
PENCIL SMOKE EVAC W/HOLSTER (ELECTROSURGICAL) ×2 IMPLANT
RTRCTR C-SECT PINK 25CM LRG (MISCELLANEOUS) ×2 IMPLANT
STRIP CLOSURE SKIN 1/2X4 (GAUZE/BANDAGES/DRESSINGS) ×2 IMPLANT
SUT PLAIN 2 0 XLH (SUTURE) ×2 IMPLANT
SUT VIC AB 0 CTX 36 (SUTURE) ×5
SUT VIC AB 0 CTX36XBRD ANBCTRL (SUTURE) ×5 IMPLANT
SUT VIC AB 4-0 KS 27 (SUTURE) ×2 IMPLANT
SYR KIT LINE DRAW 1CC W/FILTR (LINER) ×2 IMPLANT
TOWEL OR 17X24 6PK STRL BLUE (TOWEL DISPOSABLE) ×2 IMPLANT
TRAY FOLEY BAG SILVER LF 14FR (SET/KITS/TRAYS/PACK) ×2 IMPLANT

## 2017-10-22 NOTE — MAU Note (Signed)
Contractions started at 0600, [facility] they have gotten closer and stronger. No bleeding or leaking.  Was 3 cm when last checked.

## 2017-10-22 NOTE — Lactation Note (Signed)
This note was copied from a baby's chart. Lactation Consultation Note  Patient Name: Cynthia Hull AVWUJ'WToday's Date: 10/22/2017 Reason for consult: Initial assessment   Initial consult at 6911 hrs old; Mom is P2 but first child died at 6214 days.  Hx THC use.  GBS+, C/S Apgars 9/9. GA 38.4; BW 6 lbs, 8.9 oz; Previous LS by RN -7.   Infant has breastfed x2 (no time recorded); voids-3; stools-2. Mom is very pleasant and is so amazed with her newborn. Mom had requested formula, RN finger fed via curved-tip syringe 5 ml formula. As LC was doing basic teaching, infant began showing feeding cues which mom recognized, so LC assisted with latching infant to left breast cross-cradle hold.   [facility] how to position and use sandwiching for support.  Basic teaching done.   Infant latched but did not suck.  LC explained that he just received formula and wants to be close to his momma, and explained that being at the breast is comforting to him. Mom stated "this feels right" [latching, STS and breastfeeding].  Encouraged mom to breastfeed every time infant shows feeding cues. Educated on size of infant's stomach, cluster feeding, and supply and demand. Lactation brochure given and informed of hospital support group and OP services. Encouraged to call RN for assistance with latching as needed. [facility] thanked [facility] for explaining basics and encouraging her.     Maternal Data Does the patient have breastfeeding experience prior to this delivery?: No  Feeding Feeding Type: Breast Fed Length of feed: 0 min  LATCH Score Latch: Too sleepy or reluctant, no latch achieved, no sucking elicited.  Audible Swallowing: None  Type of Nipple: Everted at rest and after stimulation  Comfort (Breast/Nipple): Soft / non-tender  Hold (Positioning): Assistance needed to correctly position infant at breast and maintain latch.  LATCH Score: 7  Interventions Interventions: Breast feeding basics reviewed;Assisted with  latch;Skin to skin;Hand pump  Lactation Tools Discussed/Used WIC Program: Yes Initiated by:: RN   Consult Status Consult Status: Follow-up Date: 10/23/17 Follow-up type: In-patient    [name], [name] 10/22/2017, 10:07 PM

## 2017-10-22 NOTE — Plan of Care (Signed)
Problem: Coping: Goal: Ability to cope will improve Outcome: Progressing Mom in good spirits and excited about new baby. Copes verbally by talking about previous pregnancy and baby, in a positive manner. States that she does feel nervous at times but is bonding well physically and emotionally with baby. Also states that this hospital stay is much more relaxing, with positive staff and surroundings. Will continue to monitor.

## 2017-10-22 NOTE — Anesthesia Preprocedure Evaluation (Signed)
Anesthesia Evaluation  Patient identified by MRN, date of birth, ID band Patient awake    Reviewed: Allergy & Precautions, NPO status , Patient's Chart, lab work & pertinent test results  Airway Mallampati: II  TM Distance: >3 FB Neck ROM: Full    Dental no notable dental hx.    Pulmonary neg pulmonary ROS, former smoker,    Pulmonary exam normal breath sounds clear to auscultation       Cardiovascular negative cardio ROS Normal cardiovascular exam Rhythm:Regular Rate:Normal     Neuro/Psych negative neurological ROS  negative psych ROS   GI/Hepatic negative GI ROS, Neg liver ROS,   Endo/Other  negative endocrine ROS  Renal/GU negative Renal ROS     Musculoskeletal negative musculoskeletal ROS (+)   Abdominal   Peds  Hematology negative hematology ROS (+)   Anesthesia Other Findings   Reproductive/Obstetrics (+) Pregnancy                             Anesthesia Physical Anesthesia Plan  ASA: II  Anesthesia Plan: Spinal   Post-op Pain Management:    Induction:   PONV Risk Score and Plan: 4 or greater and Ondansetron, Dexamethasone, Scopolamine patch - Pre-op and Treatment may vary due to age or medical condition  Airway Management Planned:   Additional Equipment:   Intra-op Plan:   Post-operative Plan:   Informed Consent: I have reviewed the patients History and Physical, chart, labs and discussed the procedure including the risks, benefits and alternatives for the proposed anesthesia with the patient or authorized representative who has indicated his/her understanding and acceptance.   Dental advisory given  Plan Discussed with: CRNA  Anesthesia Plan Comments:         Anesthesia Quick Evaluation

## 2017-10-22 NOTE — Op Note (Signed)
Operative Note   SURGERY DATE: 10/22/2017  PRE-OP DIAGNOSIS:  *Pregnancy at 38/4 weeks *Active labor *History of neonatal death after difficult operative vaginal delivery *Patient desire for elective primary cesarean section  POST-OP DIAGNOSIS: Same. Delivered  PROCEDURE: primary low transverse cesarean section via pfannenstiel skin incision with double layer uterine closure  SURGEON: Surgeon(s) and Role:    Vilonia Bing* Charlott Calvario, MD - Primary  Assistant:     Doroteo Glassman* Phelps, Milderd MeagerJazma Y, DO - Fellow  ANESTHESIA: spinal  ESTIMATED BLOOD LOSS: 1199mL  DRAINS: 100 mL UOP via indwelling foley  TOTAL IV FLUIDS: 1800 mL crystalloid  VTE PROPHYLAXIS: SCDs to bilateral lower extremities  ANTIBIOTICS: Ampicillin within 1 hour of skin incision and azithromyocin 500mg  IV given intra operatively  SPECIMENS: none  COMPLICATIONS: None   FINDINGS: No intra-abdominal adhesions were noted. Grossly normal uterus, tubes and ovaries. Clear amniotic fluid, cephalic female infant, weight 2975gm, APGARs 9/9, intact placenta.  PROCEDURE IN DETAIL: The patient was taken to the operating room where anesthesia was administered and normal fetal heart tones were confirmed. She was then prepped and draped in the normal fashion in the dorsal supine position with a leftward tilt.  After a time out was performed, a pfannensteil skin incision was made with the scalpel and carried through to the underlying layer of fascia. The fascia was then incised at the midline and this incision was extended laterally with the mayo scissors. Attention was turned to the superior aspect of the fascial incision which was grasped with the kocher clamps x 2, tented up and the rectus muscles were dissected off with the bovi. In a similar fashion the inferior aspect of the fascial incision was grasped with the kocher clamps, tented up and the rectus muscles dissected off with the mayo scissors. The rectus muscles were then separated in the  midline and the peritoneum was entered bluntly. The bladder blade was inserted and the vesicouterine peritoneum was identified, tented up and entered with the metzenbaum scissors. This incision was extended laterally and the bladder flap was created digitally. The bladder blade was reinserted.  A low transverse hysterotomy was made with the scalpel until the endometrial cavity was breached and the amniotic sac ruptured with the Allis clamp, yielding clear amniotic fluid. This incision was extended bluntly and the infant's head, shoulders and body were delivered atraumatically.The cord was clamped x 2 and cut, and the infant was handed to the awaiting pediatricians, after delayed cord clamping was done.  The placenta was then gradually expressed from the uterus and then the uterus was exteriorized and cleared of all clots and debris. The hysterotomy was repaired with a running suture of 0 Monocryl. A second imbricating layer of Monocryl suture was then placed. Excellent hemostasis achieved.   The uterus and adnexa were then returned to the abdomen, and the hysterotomy and all operative sites were reinspected and excellent hemostasis was noted after irrigation and suction of the abdomen with warm saline.  The peritoneum was closed with a running stitch of 3-0 Vicryl. The fascia was reapproximated with 0 Vicryl in a simple running fashion bilaterally. The subcutaneous layer was then reapproximated with interrupted sutures of 2-0 plain gut, and the skin was then closed with 4-0 monocryl, in a subcuticular fashion.  The patient  tolerated the procedure well. Sponge, lap, needle, and instrument counts were correct x 2. The patient was transferred to the recovery room awake, alert and breathing independently in stable condition.   Caryl AdaJazma Phelps, DO OB Fellow  Agree with above. I was present and scrubbed for the entire procedure.   Cornelia Copa MD Attending Center for [employer] [facility] and exercise psychologist)

## 2017-10-22 NOTE — MAU Provider Note (Signed)
Obstetrics Admission History & Physical  10/22/2017 - 9:01 AM Primary OBGYN: [facility] [employer]  Chief Complaint: active labor  History of Present Illness  26 y.o. G2P1000 @ [redacted]w[redacted]d, with the above CC. Pregnancy complicated by: h/o NN death and difficult delivery, GBS pos, LSIL pap  [name] states that regular UCs, no VB, LOF or decreased FM  Review of Systems:  as noted in the History of Present Illness.   PMHx:  Past Medical History:  Diagnosis Date  . Headache    PSHx:  Past Surgical History:  Procedure Laterality Date  . NO PAST SURGERIES     Medications:  Prescriptions Prior to Admission  Medication Sig Dispense Refill Last Dose  . acetaminophen (TYLENOL) 500 MG tablet Take 500-1,000 mg by mouth every 6 (six) hours as needed for moderate pain or headache.    Taking  . metoCLOPramide (REGLAN) 10 MG tablet Take 1 tablet (10 mg total) by mouth 3 (three) times daily before meals. (Patient not taking: Reported on 10/13/2017) 90 tablet 1 Not Taking at Unknown time  . Prenatal Vit-Fe Fumarate-FA (PRENATAL MULTIVITAMIN) TABS tablet Take 1 tablet by mouth daily.   Taking     Allergies: has No Known Allergies. OBHx:  OB History  Gravida Para Term Preterm AB Living  2 1 1  0 0 0  SAB TAB Ectopic Multiple Live Births  0 0 0 0 1    # Outcome Date GA Lbr Len/2nd Weight Sex Delivery Anes PTL Lv  2 Current           1 Term 2017     Vag-Spont   ND                FHx:  Family History  Problem Relation Age of Onset  . Hypertension Mother    Soc Hx:  Social History   Social History  . Marital status: Single    Spouse name: N/A  . Number of children: N/A  . Years of education: N/A   Occupational History  . Not on file.   Social History Main Topics  . Smoking status: Former Smoker    Years: 1.00    Types: Cigars    Quit date: 10/08/2017  . Smokeless tobacco: Never Used  . Alcohol use No  . Drug use: Yes    Types: Marijuana     Comment: LAST USED  LAST WEEK AS OF 10/08/17  . Sexual activity: Yes    Birth control/ protection: None   Other Topics Concern  . Not on file   Social History Narrative  . No narrative on file    Objective    Current Vital Signs 24h Vital Sign Ranges  T 98.3 F (36.8 C) Temp  Avg: 98.3 F (36.8 C)  Min: 98.3 F (36.8 C)  Max: 98.3 F (36.8 C)  BP 118/65 BP  Min: 118/65  Max: 118/65  HR 90 Pulse  Avg: 90  Min: 90  Max: 90  RR 18 Resp  Avg: 18  Min: 18  Max: 18  SaO2 100 % Not Delivered SpO2  Avg: 100 %  Min: 100 %  Max: 100 %       24 Hour I/O Current Shift I/O  Time Ins Outs No intake/output data recorded. No intake/output data recorded.   EFM: 145 baseline, ?accels, no decel, mod variability  Toco: q1-12m  General: Well nourished, well developed female in no acute distress.  Skin:  Warm and dry.  Cardiovascular:  S1, S2 normal, no murmur, rub or gallop, regular rate and rhythm Respiratory:  Clear to auscultation bilateral. Normal respiratory effort Abdomen: gravid nttp Neuro/Psych:  Normal mood and affect.   SVE: 4cm per RN  Labs  Pending  Radiology Anterior placenta normal efw on 7/24 and normal AC/afi  Perinatal info  O pos/ Rubella  Immune //RPR pending/HIV negative/HepB Surf Ag negative/    Assessment & Plan  Pt stable D/w pt re: r/b/a of primary c/s and she'd like to do it as has been  Previously d/w her in clinic. [facility] and [facility]-section is very reasonable. Consent signed, labs ordered. Will do amp and re-dose with ancef if incision not within one hour. Will give azithro intra op given labor and for c-section. Can proceed when OR is ready. Last PO 1900 yesterday.   Cynthia Copaharlie Geraldin Habermehl, Jr. MD Attending Center for [facility](Faculty Practice)

## 2017-10-22 NOTE — Anesthesia Postprocedure Evaluation (Signed)
Anesthesia Post Note  Patient: Cynthia Hull  Procedure(s) Performed: CESAREAN SECTION (N/A )     Patient location during evaluation: Mother Baby Anesthesia Type: Spinal Level of consciousness: awake Pain management: satisfactory to patient Vital Signs Assessment: post-procedure vital signs reviewed and stable Respiratory status: spontaneous breathing Cardiovascular status: stable Anesthetic complications: no    Last Vitals:  Vitals:   10/22/17 1345 10/22/17 1500  BP: 115/65 (!) 100/50  Pulse: 78 79  Resp: 18 18  Temp: 36.7 C 36.7 C  SpO2: 100% 100%    Last Pain:  Vitals:   10/22/17 1500  TempSrc: Oral  PainSc: 1    Pain Goal: Patients Stated Pain Goal: 3 (10/22/17 1500)               Cephus ShellingBURGER,Viviana Trimble

## 2017-10-22 NOTE — Transfer of Care (Signed)
Immediate Anesthesia Transfer of Care Note  Patient: Cynthia Hull  Procedure(s) Performed: CESAREAN SECTION (N/A )  Patient Location: PACU  Anesthesia Type:Spinal  Level of Consciousness: awake, alert  and oriented  Airway & Oxygen Therapy: Patient Spontanous Breathing and Patient connected to nasal cannula oxygen  Post-op Assessment: Report given to RN  Post vital signs: Reviewed and stable  Last Vitals:  Vitals:   10/22/17 0849 10/22/17 1128  BP: 118/65 (!) 106/54  Pulse: 90 74  Resp: 18 19  Temp: 36.8 C   SpO2: 100% 100%    Last Pain:  Vitals:   10/22/17 0849  TempSrc: Oral         Complications: No apparent anesthesia complications

## 2017-10-22 NOTE — Anesthesia Procedure Notes (Signed)
Spinal  Patient location during procedure: OR Staffing Anesthesiologist: Nolon Nations Performed: anesthesiologist  Preanesthetic Checklist Completed: patient identified, site marked, surgical consent, pre-op evaluation, timeout performed, IV checked, risks and benefits discussed and monitors and equipment checked Spinal Block Patient position: sitting Prep: site prepped and draped and DuraPrep Patient monitoring: heart rate, continuous pulse ox and blood pressure Approach: midline Location: L3-4 Injection technique: single-shot Needle Needle type: Sprotte  Needle gauge: 24 G Needle length: 9 cm Assessment Sensory level: T6 Additional Notes Expiration date of kit checked and confirmed. Patient tolerated procedure well, without complications.

## 2017-10-22 NOTE — H&P (Signed)
Obstetrics Admission History & Physical  10/22/2017 - 9:01 AM [facility]: [facility]  Chief Complaint: active labor  History of Present Illness  25 y.o. G2P1000 @ [redacted]w[redacted]d, with the above CC. Pregnancy complicated by: h/o NN death and difficult delivery, GBS pos, LSIL pap  [name] states that regular UCs, no VB, LOF or decreased FM  Review of Systems:  as noted in the History of Present Illness.   PMHx:  Past Medical History:  Diagnosis Date  . Headache    PSHx:  Past Surgical History:  Procedure Laterality Date  . NO PAST SURGERIES     Medications:  Prescriptions Prior to Admission  Medication Sig Dispense Refill Last Dose  . acetaminophen (TYLENOL) 500 MG tablet Take 500-1,000 mg by mouth every 6 (six) hours as needed for moderate pain or headache.    Taking  . metoCLOPramide (REGLAN) 10 MG tablet Take 1 tablet (10 mg total) by mouth 3 (three) times daily before meals. (Patient not taking: Reported on 10/13/2017) 90 tablet 1 Not Taking at Unknown time  . Prenatal Vit-Fe Fumarate-FA (PRENATAL MULTIVITAMIN) TABS tablet Take 1 tablet by mouth daily.   Taking     Allergies: has No Known Allergies. OBHx:  OB History  Gravida Para Term Preterm AB Living  2 1 1 0 0 0  SAB TAB Ectopic Multiple Live Births  0 0 0 0 1    # Outcome Date GA Lbr Len/2nd Weight Sex Delivery Anes PTL Lv  2 Current           1 Term 2017     Vag-Spont   ND                FHx:  Family History  Problem Relation Age of Onset  . Hypertension Mother    Soc Hx:  Social History   Social History  . Marital status: Single    Spouse name: N/A  . Number of children: N/A  . Years of education: N/A   Occupational History  . Not on file.   Social History Main Topics  . Smoking status: Former Smoker    Years: 1.00    Types: Cigars    Quit date: 10/08/2017  . Smokeless tobacco: Never Used  . Alcohol use No  . Drug use: Yes    Types: Marijuana     Comment: LAST USED  LAST WEEK AS OF 10/08/17  . Sexual activity: Yes    Birth control/ protection: None   Other Topics Concern  . Not on file   Social History Narrative  . No narrative on file    Objective    Current Vital Signs 24h Vital Sign Ranges  T 98.3 F (36.8 C) Temp  Avg: 98.3 F (36.8 C)  Min: 98.3 F (36.8 C)  Max: 98.3 F (36.8 C)  BP 118/65 BP  Min: 118/65  Max: 118/65  HR 90 Pulse  Avg: 90  Min: 90  Max: 90  RR 18 Resp  Avg: 18  Min: 18  Max: 18  SaO2 100 % Not Delivered SpO2  Avg: 100 %  Min: 100 %  Max: 100 %       24 Hour I/O Current Shift I/O  Time Ins Outs No intake/output data recorded. No intake/output data recorded.   EFM: 145 baseline, ?accels, no decel, mod variability  Toco: q1-3m  General: Well nourished, well developed female in no acute distress.  Skin:  Warm and dry.  Cardiovascular:   S1, S2 normal, no murmur, rub or gallop, regular rate and rhythm Respiratory:  Clear to auscultation bilateral. Normal respiratory effort Abdomen: gravid nttp Neuro/Psych:  Normal mood and affect.   SVE: 4cm per RN  Labs  Pending  Radiology Anterior placenta normal efw on 7/24 and normal AC/afi  Perinatal info  O pos/ Rubella  Immune //RPR pending/HIV negative/HepB Surf Ag negative/    Assessment & Plan  Pt stable D/w pt re: r/b/a of primary c/s and she'd like to do it as has been  Previously d/w her in clinic. [facility]&D note from [facility] reviewed and primary c-section is very reasonable. Consent signed, labs ordered. Will do amp and re-dose with ancef if incision not within one hour. Will give azithro intra op given labor and for c-section. Can proceed when OR is ready. Last PO 1900 yesterday.   Cynthia Hull, Jr. MD Attending [facility] (Faculty Practice)  

## 2017-10-23 LAB — CBC
HCT: 23.9 % — ABNORMAL LOW (ref 36.0–46.0)
Hemoglobin: 8.4 g/dL — ABNORMAL LOW (ref 12.0–15.0)
MCH: 31 pg (ref 26.0–34.0)
MCHC: 35.1 g/dL (ref 30.0–36.0)
MCV: 88.2 fL (ref 78.0–100.0)
Platelets: 230 10*3/uL (ref 150–400)
RBC: 2.71 MIL/uL — ABNORMAL LOW (ref 3.87–5.11)
RDW: 13.9 % (ref 11.5–15.5)
WBC: 20.7 10*3/uL — ABNORMAL HIGH (ref 4.0–10.5)

## 2017-10-23 LAB — RPR: RPR Ser Ql: NONREACTIVE

## 2017-10-23 MED ORDER — FERROUS FUMARATE 324 (106 FE) MG PO TABS
1.0000 | ORAL_TABLET | Freq: Two times a day (BID) | ORAL | Status: DC
  Administered 2017-10-23 – 2017-10-24 (×3): 106 mg via ORAL
  Filled 2017-10-23 (×5): qty 1

## 2017-10-23 MED ORDER — IBUPROFEN 600 MG PO TABS
600.0000 mg | ORAL_TABLET | Freq: Four times a day (QID) | ORAL | Status: DC
  Administered 2017-10-23 – 2017-10-24 (×5): 600 mg via ORAL
  Filled 2017-10-23 (×2): qty 1

## 2017-10-23 NOTE — Progress Notes (Signed)
Subjective: Postpartum Day #1: Cesarean Delivery Patient reports tolerating PO, + flatus and no problems voiding; breastfeeding going well; declines contraception (abstinence)  Objective: Vital signs in last 24 hours: Temp:  [97.6 F (36.4 C)-98.3 F (36.8 C)] 98 F (36.7 C) (10/26 2000) Pulse Rate:  [66-90] 67 (10/27 0354) Resp:  [16-20] 18 (10/27 0354) BP: (97-118)/(44-93) 100/47 (10/27 0354) SpO2:  [95 %-100 %] 99 % (10/26 2000) Weight:  [78.9 kg (174 lb)] 78.9 kg (174 lb) (10/26 0849)  Physical Exam:  General: alert, cooperative and no distress Lochia: appropriate Uterine Fundus: firm Incision: honeycomb intact, dry DVT Evaluation: No evidence of DVT seen on physical exam.   Recent Labs  10/22/17 0900 10/23/17 0516  HGB 11.3* 8.4*  HCT 33.1* 23.9*    Assessment/Plan: Status post Cesarean section. Doing well postoperatively.  Continue current care. Will begin po iron due to postop anemia.  Cam HaiSHAW, Wendy Hoback CNM 10/23/2017, 7:30 AM

## 2017-10-23 NOTE — Clinical Social Work Maternal (Signed)
CLINICAL SOCIAL WORK MATERNAL/CHILD NOTE  Patient Details  Name: Cynthia Hull MRN: [id number] Date of Birth: 10-09-1991  Date:  10/23/2017  Clinical Social Worker Initiating Note:  Ferdinand Lango Darik Massing, MSW, LCSW-A Date/Time: Initiated:  10/23/17/1227     Child's Name:  Cynthia Hull    Biological Parents:  Mother, Father   Need for Interpreter:  None   Reason for Referral:  Current Substance Use/Substance Use During Pregnancy    Address:  [address] [place] [address]    Phone number:  [phone] (home)     Additional phone number: [phone]   Household Members/Support Persons (HM/SP):   Household Member/Support Person 1   HM/SP Name Relationship DOB or Age  HM/SP -1 Cynthia Hull  FOB  Unknown   HM/SP -2        HM/SP -3        HM/SP -4        HM/SP -5        HM/SP -6        HM/SP -7        HM/SP -8          Natural Supports (not living in the home):  Extended Family   Professional Supports: None   Employment: Unemployed   Type of Work: [name] unemployed    Education:  [employer] school graduate   Homebound arranged:    Museum/gallery curator Resources:  Medicaid   Other Resources:  Physicist, medical , [facility]   Cultural/Religious Considerations Which May Impact Care:  None reported.   Strengths:  Ability to meet basic needs , Compliance with medical plan , Home prepared for child    Psychotropic Medications:         Pediatrician:       Pediatrician List:   [facility]      Pediatrician Fax Number:    Risk Factors/Current Problems:  Substance Use    Cognitive State:  Able to Concentrate , Alert , Insightful , Goal Oriented    Mood/Affect:  Calm , Comfortable , Interested    CSW Assessment: CSW met with [name] at bedside to complete assessment for consult regarding hx of substance use. Upon this writers arrival, [name] was accompanied by FOB and RN  who was giving baby a bath. This writer asked [name] if this was a good time to talk. [name] noted she would speak with this writer in the hallway since baby is crying a lot. This Probation officer respected [name]'s wishes and met with her in the hallway outside the room. This Probation officer explained role and reasoning for visit. [name] was understanding. CSW congratulated [name] on baby's arrival. [name] was thankful. CSW inquired about [name]'s feelings and emotions since delivery of baby. [name] notes she is overjoyed with baby's arrival and could not be happier. CSW inquired about physical health since c-section. [name] notes she is surprisingly doing well with no complaint. CSW reviewed hospitals policy and procedure with [name] regarding substance use and drug exposed newborns. [name] notes she understands as she was fourth coming with informing her physician her last use was on Oct 08, 2017 which she notes is the anniversary of her previous child's death. [name] notes she does not use recreationally or on a daily basis but ; however, use this date to cope. CSW informed [name] that because baby's UDS was (+) for substance, this Probation officer  has to make a report to [place]. [name] noted understanding. This Probation officer inquired if [name] desired to discuss anything further or if there were any additional needs. [name] noted there are no more needs and she appreciates this Probation officer informing her of the policy. CSW thanked [name] for her time.    CPS report was made to [place] worker Wes Early.   CSW Plan/Description:  Child Protective Service Report , Hospital Drug Screen Policy Information, Sudden Infant Death Syndrome (SIDS) Education, Perinatal Mood and Anxiety Disorder (PMADs) Education    Water quality scientist, MSW, [facility]  Office: [phone]

## 2017-10-24 MED ORDER — IBUPROFEN 600 MG PO TABS: 600.0000 mg | ORAL_TABLET | Freq: Four times a day (QID) | ORAL | 0 refills | Status: DC

## 2017-10-24 MED ORDER — OXYCODONE HCL 5 MG PO TABS: 5.0000 mg | ORAL_TABLET | Freq: Four times a day (QID) | ORAL | 0 refills | Status: DC | PRN

## 2017-10-24 MED ORDER — FERROUS FUMARATE 324 (106 FE) MG PO TABS: 1.0000 | ORAL_TABLET | Freq: Two times a day (BID) | ORAL | 0 refills | Status: DC

## 2017-10-24 MED ORDER — SENNOSIDES-DOCUSATE SODIUM 8.6-50 MG PO TABS: 2.0000 | ORAL_TABLET | Freq: Every evening | ORAL | 0 refills | Status: DC | PRN

## 2017-10-24 NOTE — Discharge Instructions (Signed)
Breast Pumping Tips °If you are breastfeeding, there may be times when you cannot feed your baby directly. Returning to work or going on a trip are examples. Pumping allows you to store breast milk and feed it to your baby later. °You may not get much milk when you first start to pump. Your breasts should start to make more after a few days. If you pump at the times you usually feed your baby, you may be able to keep making enough milk to feed your baby without also using formula. The more often you pump, the more milk your body will make. °When should I pump? °· You can start to pump soon after you have your baby. Ask your doctor what is right for you and your baby. °· If you are going back to work, start pumping a few weeks before. This gives you time to learn how to pump and to store a supply of milk. °· When you are with your baby, feed your baby when he or she is hungry. Pump after each feeding. °· When you are away from your baby for many hours, pump for about 15 minutes every 2-3 hours. Pump both breasts at the same time if you can. °· If your baby has a formula feeding, make sure to pump close to the same time. °· If you drink any alcohol, wait 2 hours before pumping. °How do I get ready to pump? °Your let-down reflex is your body's natural reaction that makes your breast milk flow. It is easier to make your breast milk flow when you are relaxed. Try these things to help you relax: °· Smell one of your baby's blankets or an item of clothing. °· Look at a picture or video of your baby. °· Sit in a quiet, private space. °· Massage the breast you plan to pump. °· Place soothing warmth on the breast. °· Play relaxing music. ° °What are some breast pumping tips? °· Wash your hands before you pump. You do not need to wash your nipples or breasts. °· There are three ways to pump. You can: °? Use your hand to massage and squeeze your breast. °? Use a handheld manual pump. °? Use an electric pump. °· Make sure the  suction cup on the breast pump is the right size. Place the suction cup directly over the nipple. It can be painful or hurt your nipple if it is the wrong size or placed wrong. °· Put a small amount of purified or modified lanolin on your nipple and areola if you are sore. °· If you are using an electric pump, change the speed and suction power to be more comfortable. °· You may need a different type of pump if pumping hurts or you do not get a lot of milk. Your doctor can help you pick what type of pump to use. °· Keep a full water bottle near you always. Drinking lots of fluid helps you make more milk. °· You can store your milk to use later. Pumped breast milk can be stored in a sealable, sterile container or plastic bag. Always put the date you pumped it on the container. °? Milk can stay out at room temperature for up to 8 hours. °? You can store your milk in the refrigerator for up to 8 days. °? You can store your milk in the freezer for 3 months. Thaw frozen milk using warm water. Do not put it in the microwave. °· Do not smoke. Ask   your doctor for help. °When should I call my doctor? °· You have a hard time pumping. °· You are worried you do not make enough milk. °· You have nipple pain, soreness, or redness. °· You want to take birth control pills. °This information is not intended to replace advice given to you by your health care provider. Make sure you discuss any questions you have with your health care provider. °Document Released: 06/01/2008 Document Revised: 05/21/2016 Document Reviewed: 10/06/2013 °Elsevier Interactive Patient Education © 2017 Elsevier Inc. ° ° °Breast Pumping Tips °If you are breastfeeding, there may be times when you cannot feed your baby directly. Returning to work or going on a trip are common examples. Pumping allows you to store breast milk and feed it to your baby later. °You may not get much milk when you first start to pump. Your breasts should start to make more after a  few days. If you pump at the times you usually feed your baby, you may be able to keep making enough milk to feed your baby without also using formula. The more often you pump, the more milk you will produce. °When should I pump? °· You can begin to pump soon after delivery. However, some experts recommend waiting about 4 weeks before giving your infant a bottle to make sure breastfeeding is going well. °· If you plan to return to work, begin pumping a few weeks before. This will help you develop techniques that work best for you. It also lets you build up a supply of breast milk. °· When you are with your infant, feed on demand and pump after each feeding. °· When you are away from your infant for several hours, pump for about 15 minutes every 2-3 hours. Pump both breasts at the same time if you can. °· If your infant has a formula feeding, make sure to pump around the same time. °· If you drink any alcohol, wait 2 hours before pumping. °How do I prepare to pump? °Your let-down reflex is the natural reaction to stimulation that makes your breast milk flow. It is easier to stimulate this reflex when you are relaxed. Find relaxation techniques that work for you. If you have difficulty with your let-down reflex, try these methods: °· Smell one of your infant's blankets or an item of clothing. °· Look at a picture or video of your infant. °· Sit in a quiet, private space. °· Massage the breast you plan to pump. °· Place soothing warmth on the breast. °· Play relaxing music. ° °What are some general breast pumping tips? °· Wash your hands before you pump. You do not need to wash your nipples or breasts. °· There are three ways to pump. °? You can use your hand to massage and compress your breast. °? You can use a handheld manual pump. °? You can use an electric pump. °· Make sure the suction cup (flange) on the breast pump is the right size. Place the flange directly over the nipple. If it is the wrong size or placed  the wrong way, it may be painful and cause nipple damage. °· If pumping is uncomfortable, apply a small amount of purified or modified lanolin to your nipple and areola. °· If you are using an electric pump, adjust the speed and suction power to be more comfortable. °· If pumping is painful or if you are not getting very much milk, you may need a different type of pump. A lactation consultant can help   you determine what type of pump to use. °· Keep a full water bottle near you at all times. Drinking lots of fluid helps you make more milk. °· You can store your milk to use later. Pumped breast milk can be stored in a sealable, sterile container or plastic bag. Label all stored breast milk with the date you pumped it. °? Milk can stay out at room temperature for up to 8 hours. °? You can store your milk in the refrigerator for up to 8 days. °? You can store your milk in the freezer for 3 months. Thaw frozen milk using warm water. Do not put it in the microwave. °· Do not smoke. Smoking can lower your milk supply and harm your infant. If you need help quitting, ask your health care provider to recommend a program. °When should I call my health care provider or a lactation consultant? °· You are having trouble pumping. °· You are concerned that you are not making enough milk. °· You have nipple pain, soreness, or redness. °· You want to use birth control. Birth control pills may lower your milk supply. Talk to your health care provider about your options. °This information is not intended to replace advice given to you by your health care provider. Make sure you discuss any questions you have with your health care provider. °Document Released: 06/03/2010 Document Revised: 05/27/2016 Document Reviewed: 10/06/2013 °Elsevier Interactive Patient Education © 2017 Elsevier Inc. ° °Home Care Instructions for Mom °ACTIVITY °· Gradually return to your regular activities. °· Let yourself rest. Nap while your baby sleeps. °· Avoid  lifting anything that is heavier than 10 lb (4.5 kg) until your health care provider says it is okay. °· Avoid activities that take a lot of effort and energy (are strenuous) until approved by your health care provider. Walking at a slow-to-moderate pace is usually safe. °· If you had a cesarean delivery: °? Do not vacuum, climb stairs, or drive a car for 4-6 weeks. °? Have someone help you at home until you feel like you can do your usual activities yourself. °? Do exercises as told by your health care provider, if this applies. ° °VAGINAL BLEEDING °You may continue to bleed for 4-6 weeks after delivery. Over time, the amount of blood usually decreases and the color of the blood usually gets lighter. However, the flow of bright red blood may increase if you have been too active. If you need to use more than one pad in an hour because your pad gets soaked, or if you pass a large clot: °· Lie down. °· Raise your feet. °· Place a cold compress on your lower abdomen. °· Rest. °· Call your health care provider. ° °If you are breastfeeding, your period should return anytime between 8 weeks after delivery and the time that you stop breastfeeding. If you are not breastfeeding, your period should return 6-8 weeks after delivery. °PERINEAL CARE °The perineal area, or perineum, is the part of your body between your thighs. After delivery, this area needs special care. Follow these instructions as told by your health care provider. °· Take warm tub baths for 15-20 minutes. °· Use medicated pads and pain-relieving sprays and creams as told. °· Do not use tampons or douches until vaginal bleeding has stopped. °· Each time you go to the bathroom: °? Use a peri bottle. °? Change your pad. °? Use towelettes in place of toilet paper until your stitches have healed. °· Do Kegel exercises every   day. Kegel exercises help to maintain the muscles that support the vagina, bladder, and bowels. You can do these exercises while you are  standing, sitting, or lying down. To do Kegel exercises: °? Tighten the muscles of your abdomen and the muscles that surround your birth canal. °? Hold for a few seconds. °? Relax. °? Repeat until you have done this 5 times in a row. °· To prevent hemorrhoids from developing or getting worse: °? Drink enough fluid to keep your urine clear or pale yellow. °? Avoid straining when having a bowel movement. °? Take over-the-counter medicines and stool softeners as told by your health care provider. ° °BREAST CARE °· Wear a tight-fitting bra. °· Avoid taking over-the-counter pain medicine for breast discomfort. °· Apply ice to the breasts to help with discomfort as needed: °? Put ice in a plastic bag. °? Place a towel between your skin and the bag. °? Leave the ice on for 20 minutes or as told by your health care provider. ° °NUTRITION °· Eat a well-balanced diet. °· Do not try to lose weight quickly by cutting back on calories. °· Take your prenatal vitamins until your postpartum checkup or until your health care provider tells you to stop. ° °POSTPARTUM DEPRESSION °You may find yourself crying for no apparent reason and unable to cope with all of the changes that come with having a newborn. This mood is called postpartum depression. Postpartum depression happens because your hormone levels change after delivery. If you have postpartum depression, get support from your partner, friends, and family. If the depression does not go away on its own after several weeks, contact your health care provider. °BREAST SELF-EXAM °Do a breast self-exam each month, at the same time of the month. If you are breastfeeding, check your breasts just after a feeding, when your breasts are less full. If you are breastfeeding and your period has started, check your breasts on day 5, 6, or 7 of your period. °Report any lumps, bumps, or discharge to your health care provider. Know that breasts are normally lumpy if you are breastfeeding. This is  temporary, and it is not a health risk. °INTIMACY AND SEXUALITY °Avoid sexual activity for at least 3-4 weeks after delivery or until the brownish-red vaginal flow is completely gone. If you want to avoid pregnancy, use some form of birth control. You can get pregnant after delivery, even if you have not had your period. °SEEK MEDICAL CARE IF: °· You feel unable to cope with the changes that a child brings to your life, and these feelings do not go away after several weeks. °· You notice a lump, a bump, or discharge on your breast. ° °SEEK IMMEDIATE MEDICAL CARE IF: °· Blood soaks your pad in 1 hour or less. °· You have: °? Severe pain or cramping in your lower abdomen. °? A bad-smelling vaginal discharge. °? A fever that is not controlled by medicine. °? A fever, and an area of your breast is red and sore. °? Pain or redness in your calf. °? Sudden, severe chest pain. °? Shortness of breath. °? Painful or bloody urination. °? Problems with your vision. °· You vomit for 12 hours or longer. °· You develop a severe headache. °· You have serious thoughts about hurting yourself, your child, or anyone else. ° °This information is not intended to replace advice given to you by your health care provider. Make sure you discuss any questions you have with your health care provider. °Document   Released: 12/11/2000 Document Revised: 05/21/2016 Document Reviewed: 06/17/2015 °Elsevier Interactive Patient Education © 2017 Elsevier Inc. ° °

## 2017-10-24 NOTE — Lactation Note (Signed)
This note was copied from a baby's chart. Lactation Consultation Note  Patient Name: Cynthia Hull Reason for consult: Follow-up assessment;Other (Comment);Infant weight loss (8% weight los ) Baby is 5049 hours old,  As LC entered the room mom expressed her excitement for pumping off milk over 15 ml  And just had fed the baby 5 ml. LC reassured mom that was good sign the milk is coming in.  Per mom will have a DEBP at home - Med[employer].  LC discussed the baby's 8 % weight loss , and reviewed the doc flow sheets ( updated per mom )  LC recommended since the voids and stools correlate with the weight loss, and her milk is coming in  To feed on the 1st breast / soften well, offer the 2nd breast, if the baby is sluggish can supplement the milk  She pumps off. Also after 4 feedings a day pump both breast 10 -15 mins .  Mom denies soreness , sore nipple and engorgement prevention and tx reviewed.  Dicussed nutritive vs non - nutritive feeding patterns and the importance of watching for hanging out latch at the  Breast. Recommended feeding STS until the baby can stay awake for a feeding.  Mother informed of post-discharge support and given phone number to the lactation department, including services for phone call assistance; out-patient appointments; and breastfeeding support group. List of other breastfeeding resources in the community given in the handout. Encouraged mother to call for problems or concerns related to breastfeeding.  Maternal Data    Feeding Feeding Type: Breast Milk Length of feed: 10 min  LATCH Score                   Interventions Interventions: Breast feeding basics reviewed  Lactation Tools Discussed/Used     Consult Status Consult Status: Complete Date: 10/24/17    Cynthia Hull Hull, 11:42 AM

## 2017-10-24 NOTE — Discharge Summary (Signed)
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 yo G2P2001 at [redacted]w[redacted]d was admitted to the hospital 10/22/2017 for scheduled cesarean section with the following indication:History of fetal demise with prior vaginal delivery.  Membrane Rupture Time/Date: 10:37 AM ,10/22/2017   Patient delivered a Viable infant.10/22/2017  Details of operation can be found in separate operative note.  Pateint had an uncomplicated postpartum course.  She is ambulating, tolerating a regular diet, passing flatus, and urinating well. Patient is discharged home in stable condition on  10/24/17         Physical exam  Vitals:   10/23/17 0354 10/23/17 0800 10/23/17 2114 10/24/17 0550  BP: (!) 100/47 (!) 110/38 (!) 118/55 (!) 108/58  Pulse: 67 77 78 72  Resp: 18 20 18 18   Temp:  98.2 F (36.8 C) 99 F (37.2 C) 98.7 F (37.1 C)  TempSrc:  Oral Oral Oral  SpO2:  94% 100%   Weight:      Height:       General: alert, cooperative and no distress Lochia: appropriate Uterine Fundus: firm Incision: Dressing is clean, dry, and intact DVT Evaluation: No evidence of DVT seen on physical exam. Labs: Lab Results  Component Value Date   WBC 20.7 (H) 10/23/2017   HGB 8.4 (L) 10/23/2017   HCT 23.9 (L) 10/23/2017   MCV 88.2 10/23/2017   PLT  230 10/23/2017   CMP Latest Ref Rng & Units 02/23/2016  Glucose 65 - 99 mg/dL 93  BUN 6 - 20 mg/dL 8  Creatinine 7.82 - 9.56 mg/dL 2.13  Sodium 086 - 578 mmol/L 131(L)  Potassium 3.5 - 5.1 mmol/L 3.9  Chloride 101 - 111 mmol/L 102  CO2 22 - 32 mmol/L 21(L)  Calcium 8.9 - 10.3 mg/dL 10.4(H)  Total Protein 6.5 - 8.1 g/dL 7.3  Total Bilirubin 0.3 - 1.2 mg/dL 0.6  Alkaline Phos 38 - 126 U/L 45  AST 15 - 41 U/L 21  ALT 14 - 54 U/L 21    Discharge instruction: per After Visit Summary and "Baby and Me Booklet".  After visit meds:  Allergies as of 10/24/2017   No Known Allergies     Medication List    STOP taking these medications   metoCLOPramide 10 MG tablet Commonly known as:  REGLAN     TAKE these medications   acetaminophen 500 MG tablet Commonly known as:  TYLENOL Take  500-1,000 mg by mouth every 6 (six) hours as needed for moderate pain or headache.   Ferrous Fumarate 324 (106 Fe) MG Tabs tablet Commonly known as:  HEMOCYTE - 106 mg FE Take 1 tablet (106 mg of iron total) by mouth 2 (two) times daily.   ibuprofen 600 MG tablet Commonly known as:  ADVIL,MOTRIN Take 1 tablet (600 mg total) by mouth every 6 (six) hours.   oxyCODONE 5 MG immediate release tablet Commonly known as:  Oxy IR/ROXICODONE Take 1 tablet (5 mg total) by mouth every 6 (six) hours as needed for severe pain or breakthrough pain.   prenatal multivitamin Tabs tablet Take 1 tablet by mouth daily.   senna-docusate 8.6-50 MG tablet Commonly known as:  Senokot-S Take 2 tablets by mouth at bedtime as needed for mild constipation.       Diet: routine diet  Activity: Advance as tolerated. Pelvic rest for 6 weeks.   Outpatient follow up:4 weeks Follow up Appt:No future appointments. Follow up Visit: Follow-up Information    [facility] Schedule an appointment as soon as possible for a visit in 4 week(s).   Specialty:  Obstetrics and Gynecology Why:  Please arrange  for post partum follow up appointment in 4 weeks Contact information: [address] [place] [phone]          Postpartum contraception: Abstinence  Newborn Data: Live born female  Birth Weight: 6 lb 8.9 oz (2975 g) APGAR: 9, 9  Newborn Delivery   Birth date/time:  10/22/2017 10:38:00 Delivery type:  C-Section, Low Transverse  C-section categorization:  Primary     Baby Feeding: Breast Disposition:home with mother   10/24/2017 Suella BroadKeriann S Minott, MD PGY-1  OB FELLOW DISCHARGE ATTESTATION  I have seen and examined this patient. I agree with above documentation and have made edits as needed.   Caryl AdaJazma Marleena Shubert, DO OB Fellow 9:25 AM

## 2017-10-25 ENCOUNTER — Inpatient Hospital Stay (HOSPITAL_COMMUNITY)
Admission: RE | Admit: 2017-10-25 | Payer: Medicaid Other | Source: Ambulatory Visit | Admitting: Obstetrics & Gynecology

## 2017-11-11 ENCOUNTER — Inpatient Hospital Stay (HOSPITAL_COMMUNITY)
Admission: AD | Admit: 2017-11-11 | Discharge: 2017-11-11 | Disposition: A | Discharge status: Home / Self Care | Payer: Medicaid Other | Source: Ambulatory Visit | Attending: Obstetrics & Gynecology | Admitting: Obstetrics & Gynecology

## 2017-11-11 ENCOUNTER — Encounter (HOSPITAL_COMMUNITY): Payer: Self-pay | Admitting: *Deleted

## 2017-11-11 DIAGNOSIS — N6459 Other signs and symptoms in breast: Secondary | ICD-10-CM | POA: Diagnosis not present

## 2017-11-11 DIAGNOSIS — N644 Mastodynia: Secondary | ICD-10-CM | POA: Diagnosis present

## 2017-11-11 DIAGNOSIS — Z87891 Personal history of nicotine dependence: Secondary | ICD-10-CM | POA: Insufficient documentation

## 2017-11-11 DIAGNOSIS — O9279 Other disorders of lactation: Secondary | ICD-10-CM

## 2017-11-11 DIAGNOSIS — Z79899 Other long term (current) drug therapy: Secondary | ICD-10-CM | POA: Insufficient documentation

## 2017-11-11 NOTE — MAU Provider Note (Signed)
History     CSN: [id number]  Arrival date and time: 11/11/17 0909   First Provider Initiated Contact [phone] Patient 11/11/17 1016      Chief Complaint  Patient presents with  . Breast Pain   HPI  [name] is a 26 y.o. 502P2001 non-pregnant female presenting to MAU with complaints of RT breast feeling "really hard" and feel lumps. She is only pumping and left breast is okay and right breast hurts a lot. She reports pumping every 4-6 hrs, but read on the internet that she should be pumping every 2-3 hrs. She denies fever, chills or body aches.  Past Medical History:  Diagnosis Date  . Headache     Past Surgical History:  Procedure Laterality Date  . CESAREAN SECTION N/A 10/22/2017   Procedure: CESAREAN SECTION;  Surgeon:  BingPickens, Charlie, MD;  [facility];  Service: Obstetrics;  Laterality: N/A;  . NO PAST SURGERIES      Family History  Problem Relation Age of Onset  . Hypertension Mother     Social History   Tobacco Use  . Smoking status: Former Smoker    Years: 1.00    Types: Cigars    Last attempt to quit: 10/08/2017    Years since quitting: 0.0  . Smokeless tobacco: Never Used  Substance Use Topics  . Alcohol use: No  . Drug use: Yes    Types: Marijuana    Comment: LAST USED LAST WEEK AS OF 10/08/17    Allergies: No Known Allergies  Medications Prior to Admission  Medication Sig Dispense Refill Last Dose  . acetaminophen (TYLENOL) 500 MG tablet Take 500-1,000 mg by mouth every 6 (six) hours as needed for moderate pain or headache.    Past Month at Unknown time  . Ferrous Fumarate (HEMOCYTE - 106 MG FE) 324 (106 Fe) MG TABS tablet Take 1 tablet (106 mg of iron total) by mouth 2 (two) times daily. 60 tablet 0   . ibuprofen (ADVIL,MOTRIN) 600 MG tablet Take 1 tablet (600 mg total) by mouth every 6 (six) hours. 30 tablet 0   . oxyCODONE (OXY IR/ROXICODONE) 5 MG immediate release tablet Take 1 tablet (5 mg total) by mouth every 6 (six)  hours as needed for severe pain or breakthrough pain. 15 tablet 0   . Prenatal Vit-Fe Fumarate-FA (PRENATAL MULTIVITAMIN) TABS tablet Take 1 tablet by mouth daily.   10/21/2017 at Unknown time  . senna-docusate (SENOKOT-S) 8.6-50 MG tablet Take 2 tablets by mouth at bedtime as needed for mild constipation. 30 tablet 0     Review of Systems  Constitutional: Negative.   HENT: Negative.   Eyes: Negative.   Respiratory: Negative.   Cardiovascular: Negative.   Gastrointestinal: Negative.   Endocrine: Negative.   Genitourinary: Negative.   Musculoskeletal: Negative.   Skin: Negative for color change.       Lumps felt in RT breast  Allergic/Immunologic: Negative.   Neurological: Negative.   Hematological: Negative.   Psychiatric/Behavioral: Negative.    Physical Exam   Blood pressure 130/64, pulse 71, temperature (!) 97.5 F (36.4 C), temperature source Oral, resp. rate 16, height 5\' 3"  (1.6 m), weight 162 lb (73.5 kg), SpO2 100 %, currently pumping breast milk only.  Physical Exam  Respiratory: She exhibits bony tenderness. Right breast exhibits mass (linear area of lumps c/w clogged milk ducts) and nipple discharge (milk).    MAU Course  Procedures  MDM Warm Compresses to breasts Double Pump -- pump breasts until  emptied // patient was able to pump 2.5 bottles worth of milk -- patient very pleased and happy with her results  Assessment and Plan  Engorgement of breast associated with childbirth, postpartum - Information provided on engorgement - Advised to pump/nurse every 2-3 hrs for 15-20 mins - Lactation dept phone number provided   Discharge home Patient verbalized an understanding of the plan of care and agrees.   Cynthia Moraolitta Taneeka Curtner, MSN, CNM 11/11/2017, 10:17 AM

## 2017-11-11 NOTE — MAU Note (Signed)
Pt reports her right breast is really hard and she can feel lumps. States she is only pumping, and the left breast is okay, but the right breast hurts a lot.  Unknown if she has had a fever.

## 2017-11-11 NOTE — Discharge Instructions (Signed)
You should use warm compress on your breast before pumping.  Pump your breasts for 15-20 mins every 2-3 hours. Make sure you empty breasts each time you pump. If you develop lumps in your breasts, try massaging the lumps out in the direction of the nipple as you pump. If you develop fever, flu-like symptoms, red streaks, or redness on your breasts, take Tylenol 1000 mg by mouth every 6 hrs and alternate with Ibuprofen 600 mg every 6 hrs.  If your fever does not resolve, return to MAU for evaluation of mastitis.

## 2017-11-29 ENCOUNTER — Ambulatory Visit: Payer: Self-pay | Admitting: Advanced Practice Midwife

## 2017-11-30 ENCOUNTER — Other Ambulatory Visit (HOSPITAL_COMMUNITY)
Admission: RE | Admit: 2017-11-30 | Discharge: 2017-11-30 | Disposition: A | Discharge status: Home / Self Care | Payer: Medicaid Other | Source: Ambulatory Visit | Attending: Advanced Practice Midwife | Admitting: Advanced Practice Midwife

## 2017-11-30 ENCOUNTER — Ambulatory Visit (INDEPENDENT_AMBULATORY_CARE_PROVIDER_SITE_OTHER): Payer: Medicaid Other | Admitting: Medical

## 2017-11-30 ENCOUNTER — Encounter: Payer: Self-pay | Admitting: Medical

## 2017-11-30 VITALS — BP 126/74 | HR 77 | Ht 63.0 in | Wt 157.7 lb

## 2017-11-30 DIAGNOSIS — R87612 Low grade squamous intraepithelial lesion on cytologic smear of cervix (LGSIL): Secondary | ICD-10-CM | POA: Diagnosis present

## 2017-11-30 DIAGNOSIS — Z23 Encounter for immunization: Secondary | ICD-10-CM | POA: Diagnosis not present

## 2017-11-30 NOTE — Patient Instructions (Signed)

## 2017-11-30 NOTE — Progress Notes (Signed)
Subjective:     Cynthia Hull is a 26 y.o. female who presents for a postpartum visit. She is 4 weeks postpartum following a low cervical transverse Cesarean section. I have fully reviewed the prenatal and intrapartum course. The delivery was at 38 gestational weeks. Outcome: primary cesarean section, low transverse incision. Anesthesia: spinal. Postpartum course has been normal. Baby's course has been normal. Baby is feeding by bottle Rush Barer- Gerber Good start . Bleeding brown. Bowel function is normal. Bladder function is normal. Patient is sexually active (once). Contraception method is condoms. Postpartum depression screening: negative.  The following portions of the patient's history were reviewed and updated as appropriate: allergies, current medications, past family history, past medical history, past social history, past surgical history and problem list.  Review of Systems Pertinent items are noted in HPI.   Objective:    BP 126/74   Pulse 77   Ht 5\' 3"  (1.6 m)   Wt 157 lb 11.2 oz (71.5 kg)   BMI 27.94 kg/m   General:  alert and cooperative   Breasts:  not performed  Lungs: clear to auscultation bilaterally  Heart:  regular rate and rhythm, S1, S2 normal, no murmur, click, rub or gallop  Abdomen: soft, non-tender; bowel sounds normal; no masses,  no organomegaly incision is well-healed   Vulva:  normal  Vagina: vagina positive for moderate white discharge and scant brown  Cervix:  no bleeding following Pap, no cervical motion tenderness and no lesions  Corpus: normal  Adnexa:  normal adnexa  Rectal Exam: Not performed.        Assessment:     Normal postpartum exam. Pap smear done at today's visit. LGSIL on pap in 2017, had normal colposcopy. This will be first repeat pap.   Plan:    1. Contraception: condoms 2. Patient will be contacted with any abnormal pap results  3. Follow up in: 1 year for annual exam or sooner as needed.  Cynthia Hull, Cynthia N, PA-C  11/30/2017 3:24 PM

## 2017-12-02 LAB — CYTOLOGY - PAP
Adequacy: ABSENT
Diagnosis: NEGATIVE
HPV: NOT DETECTED

## 2018-03-11 IMAGING — US US OB TRANSVAGINAL
1 series · 15 of 28 positions shown · non-contrast
Comparison: None.

CLINICAL DATA: Abdominal pain with positive pregnancy test.

EXAM:
OBSTETRIC <14 WK US AND TRANSVAGINAL OB US
TECHNIQUE: Both transabdominal and transvaginal ultrasound examinations were
performed for complete evaluation of the gestation as well as the
maternal uterus, adnexal regions, and pelvic cul-de-sac.
Transvaginal technique was performed to assess early pregnancy.

[Series 1: us ob transvaginal · 15 of 74 slices shown]
[im 1/74]
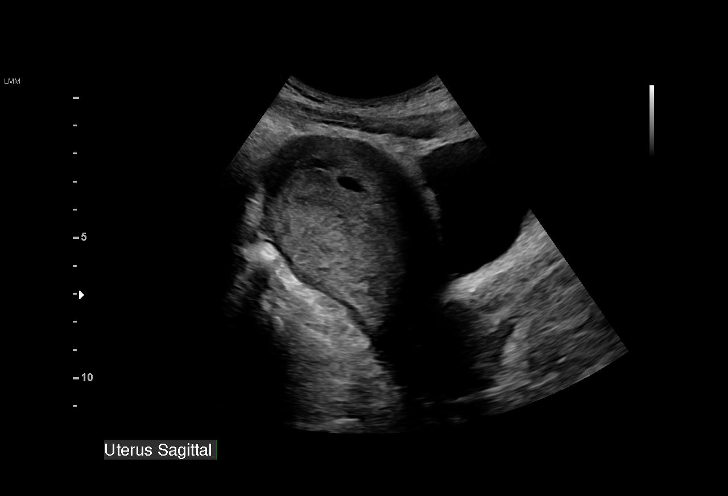
[im 6/74]
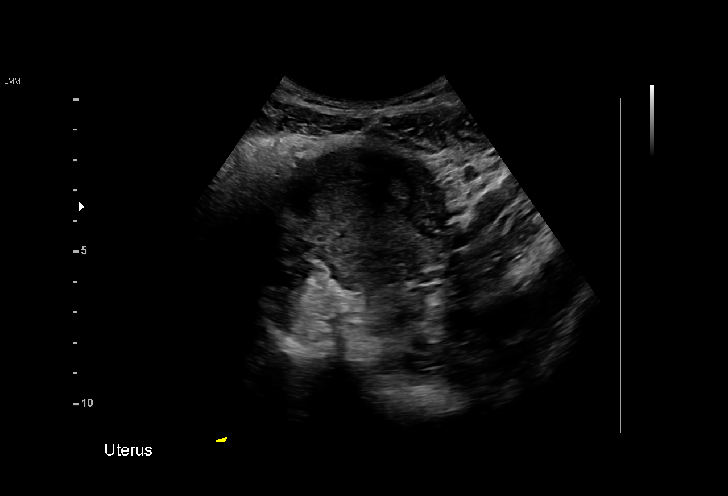
[im 11/74]
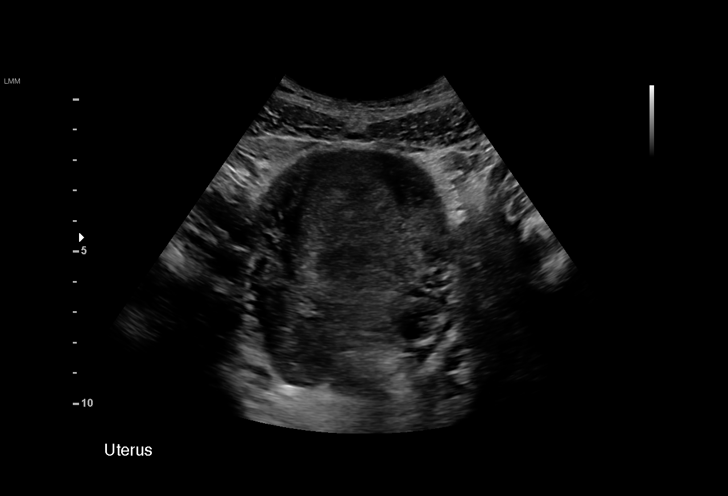
[im 17/74]
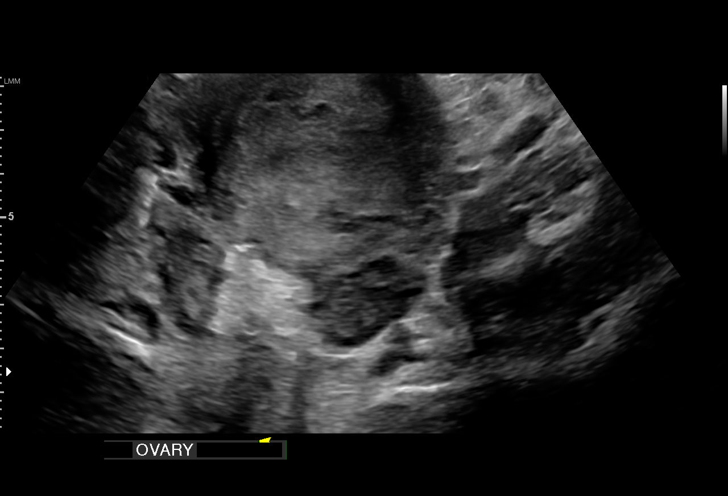
[im 22/74]
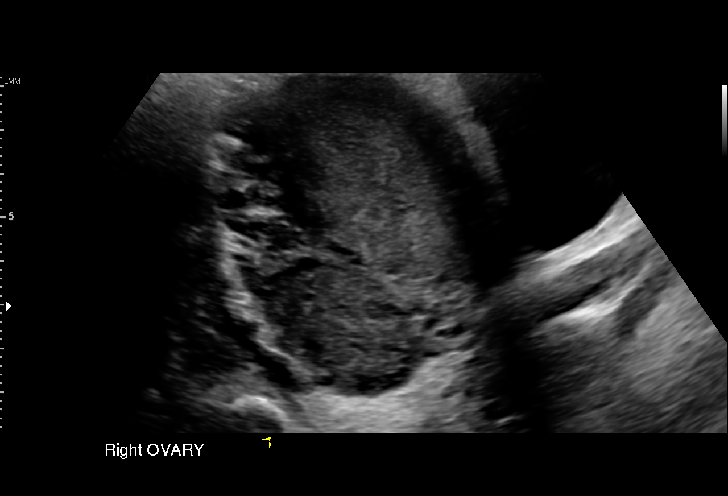
[im 28/74]
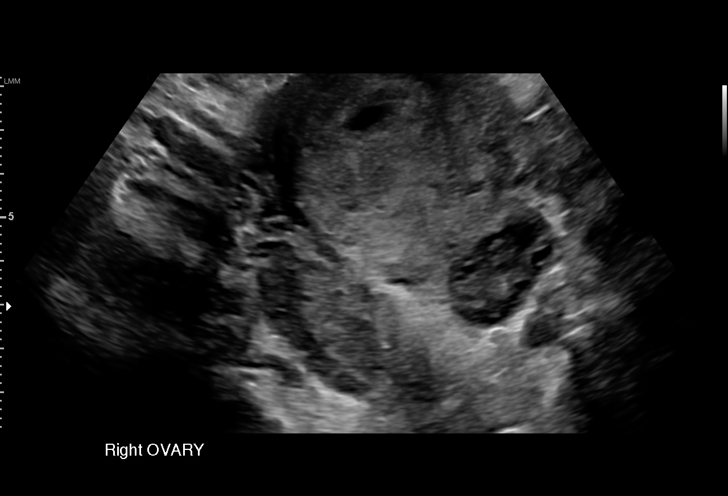
[im 33/74]
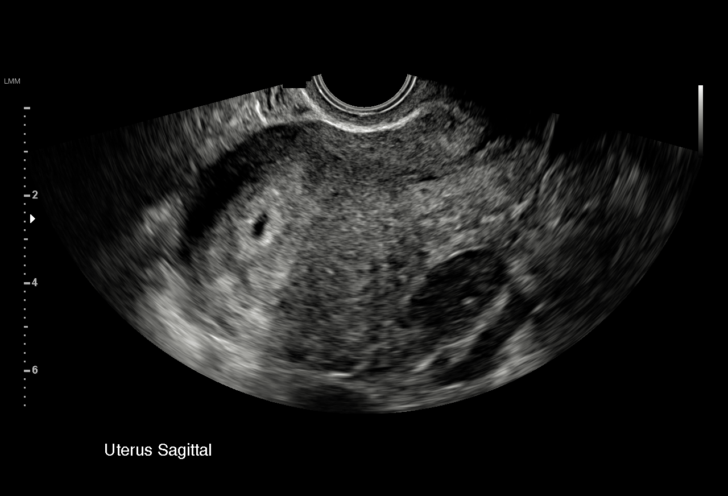
[im 38/74]
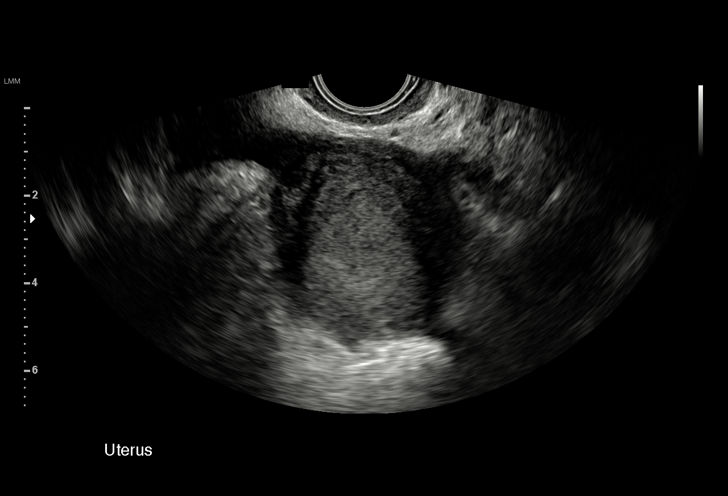
[im 41/74]
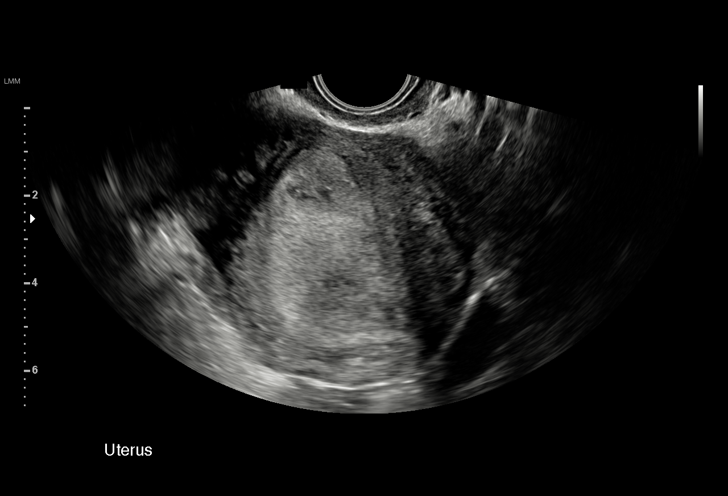
[im 46/74]
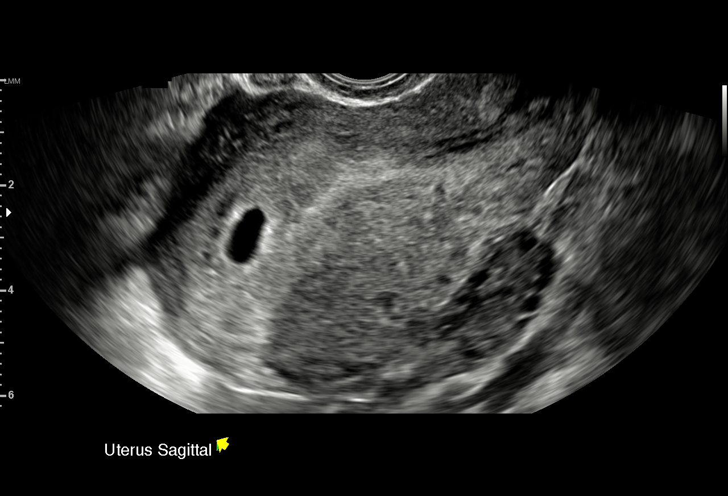
[im 52/74]
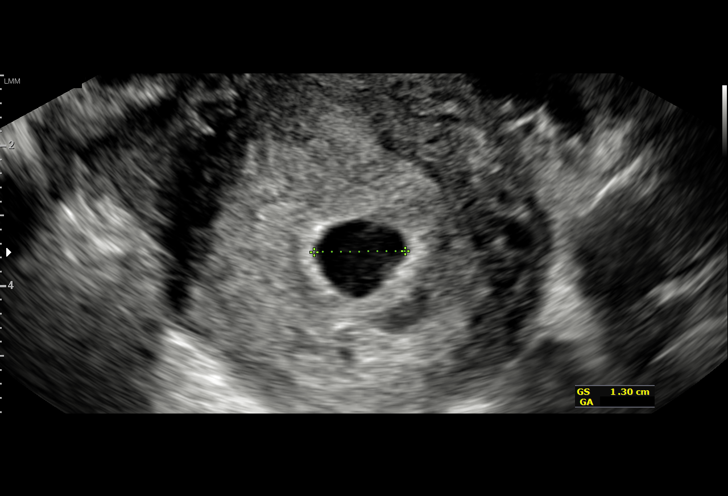
[im 57/74]
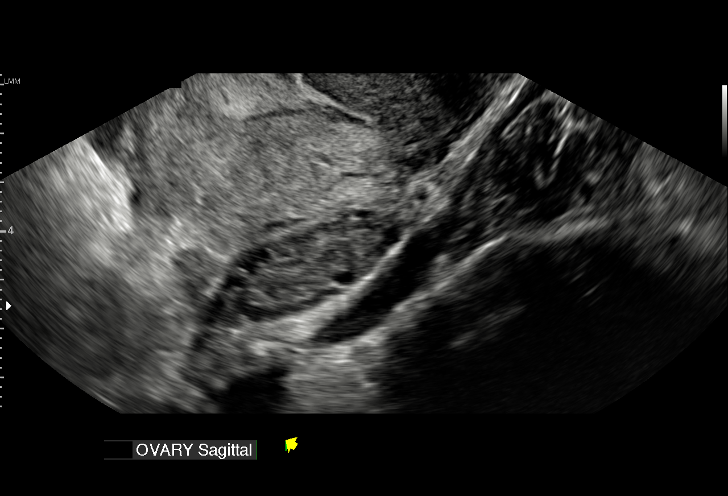
[im 63/74]
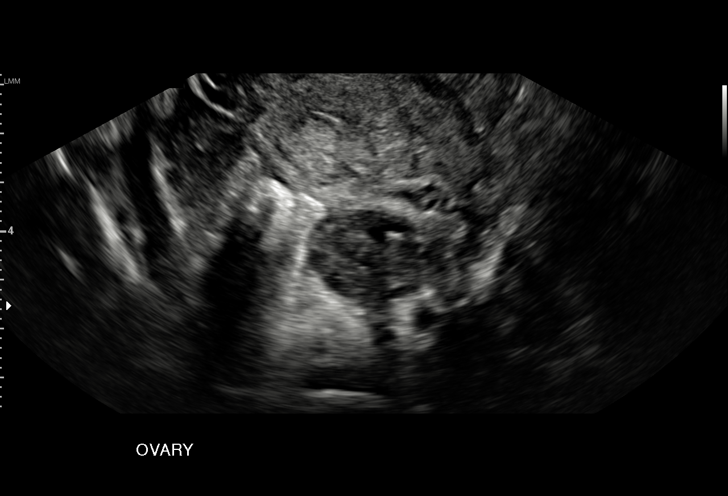
[im 68/74]
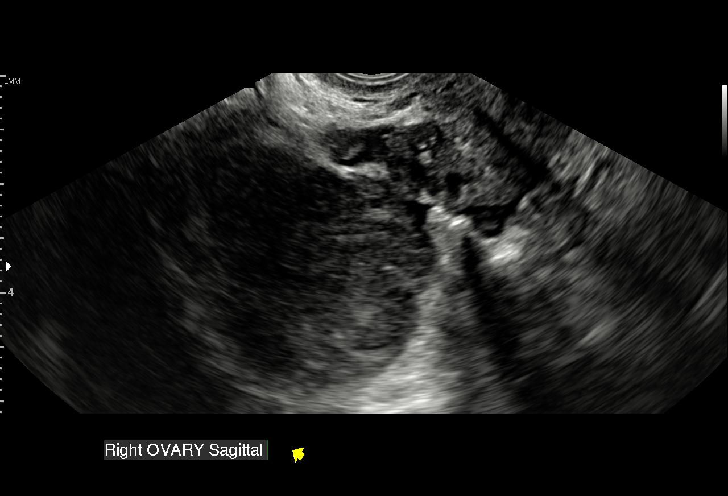
[im 74/74]
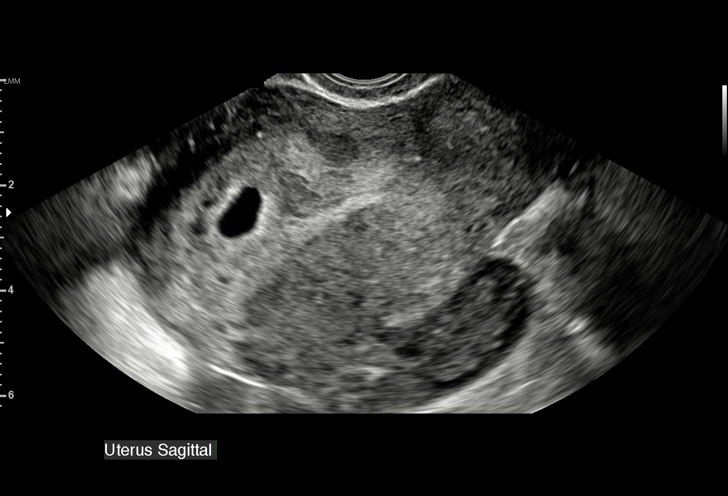

[15 of 28 positions shown; findings below may reference images not displayed]

FINDINGS: Intrauterine gestational sac: Single.

Yolk sac:  Visualized.

Embryo:  Not visualized.

MSD: 10  mm   5 w   5  d

Subchorionic hemorrhage:  None visualized.

Maternal uterus/adnexae: Maternal ovaries are unremarkable. No
evidence for intraperitoneal free fluid.
IMPRESSION: Single intrauterine gestational sac with yolk sac visualized but no
embryo yet apparent. Estimated gestational age by mean sac diameter
is 5 week 5 day.

## 2018-03-13 IMAGING — US US OB TRANSVAGINAL
1 series · 15 of 28 positions shown · non-contrast
Comparison: Pelvic ultrasound 03/06/2017.

CLINICAL DATA: Pregnant patient with vaginal bleeding.

EXAM:
TRANSVAGINAL OB ULTRASOUND
TECHNIQUE: Transvaginal ultrasound was performed for complete evaluation of the
gestation as well as the maternal uterus, adnexal regions, and
pelvic cul-de-sac.

[Series 1: us ob transvaginal · 15 of 47 slices shown]
[im 1/47]
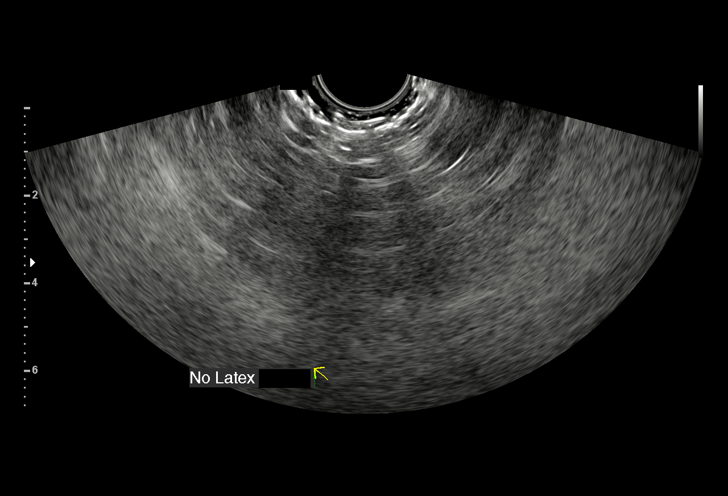
[im 4/47]
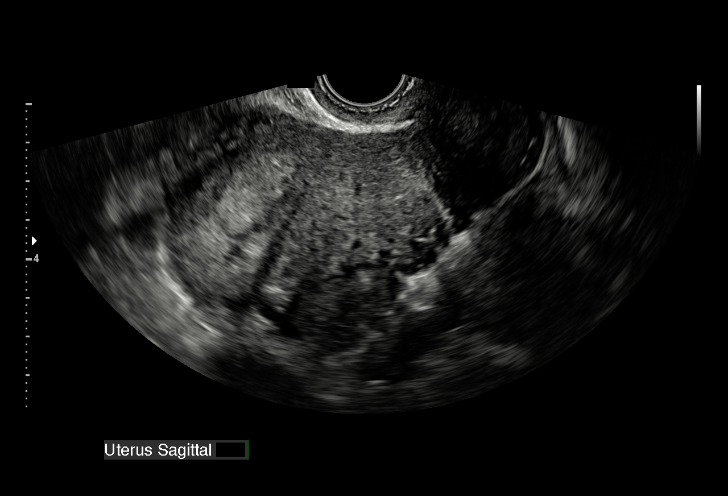
[im 7/47]
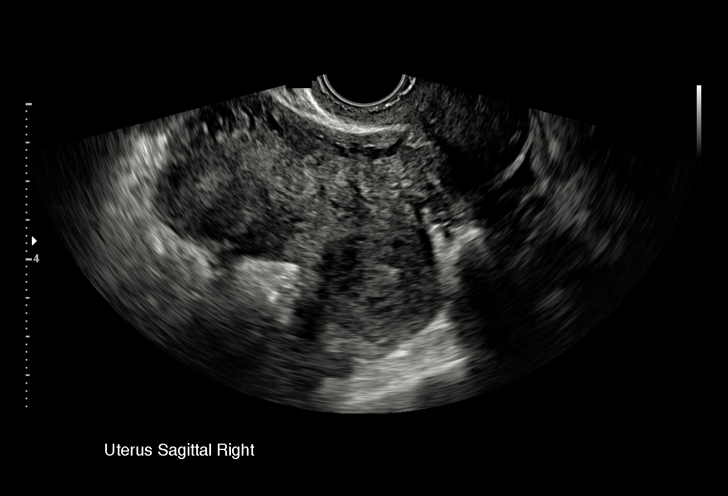
[im 11/47]
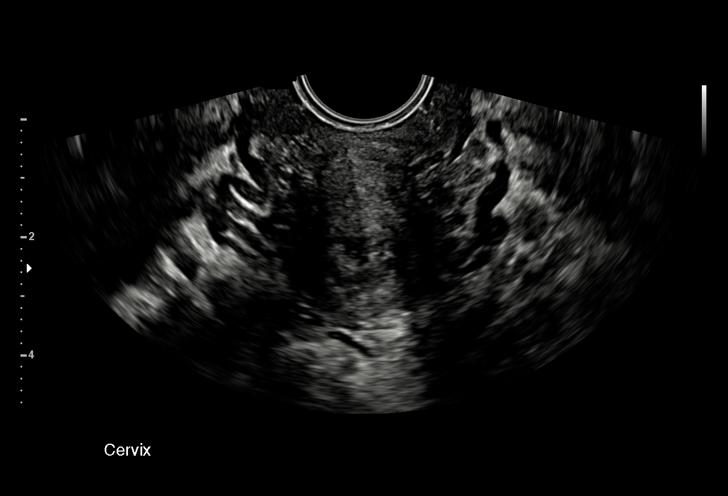
[im 14/47]
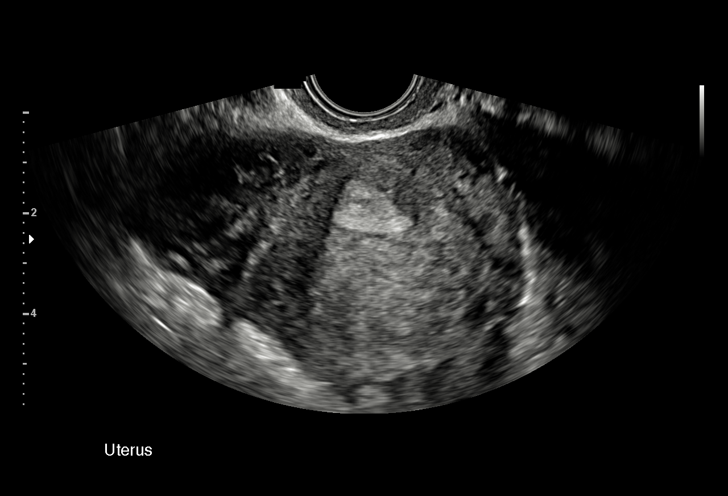
[im 18/47]
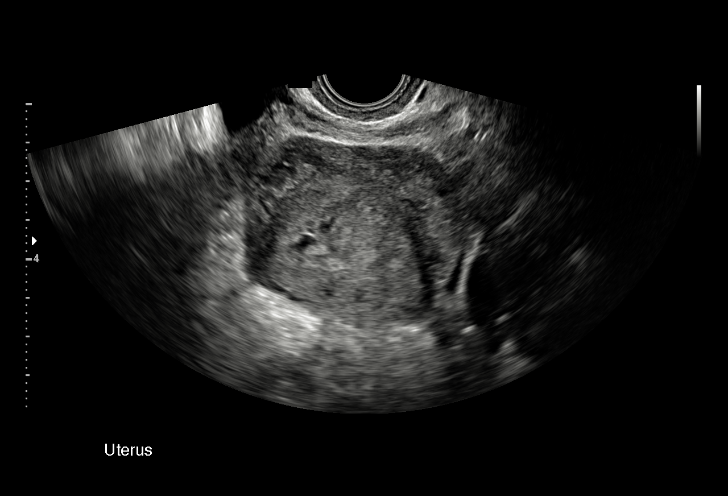
[im 21/47]
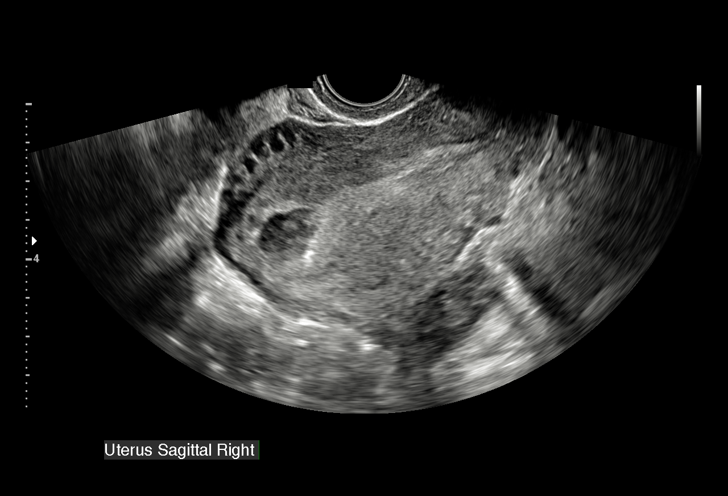
[im 24/47]
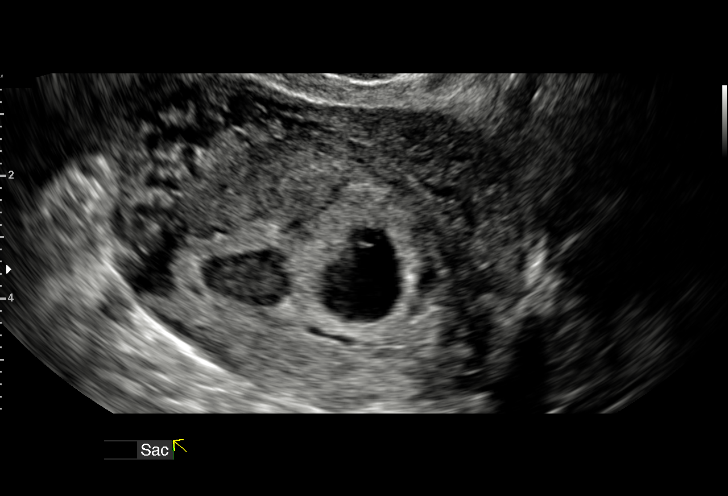
[im 26/47]
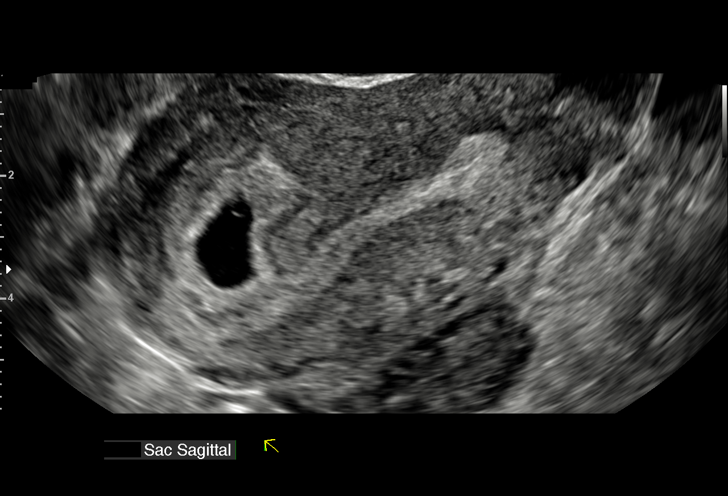
[im 29/47]
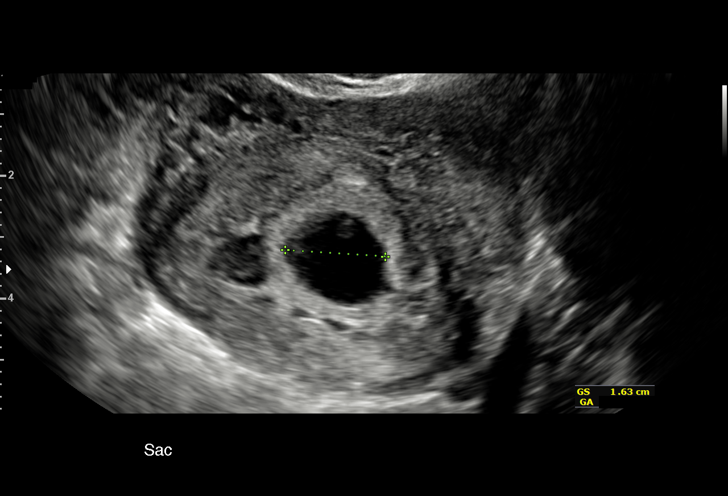
[im 33/47]
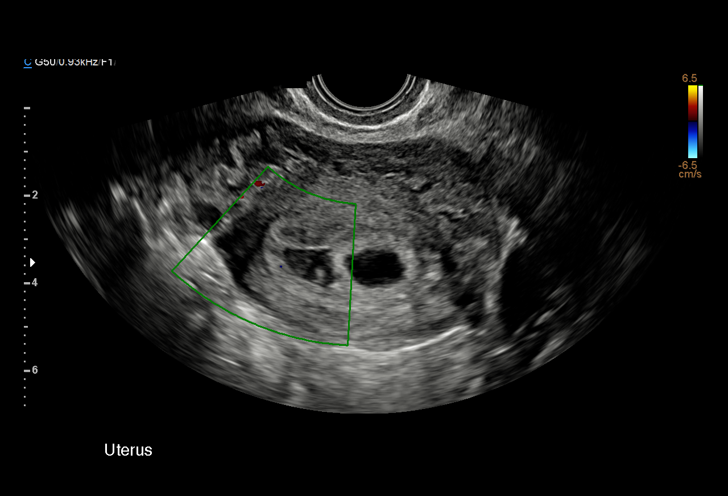
[im 36/47]
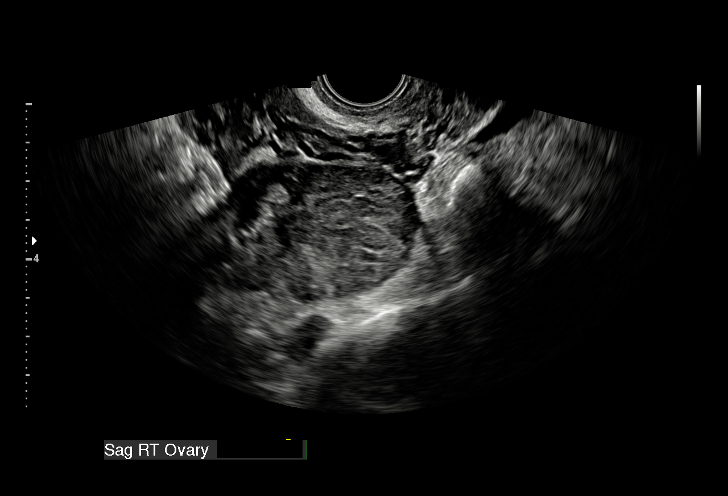
[im 40/47]
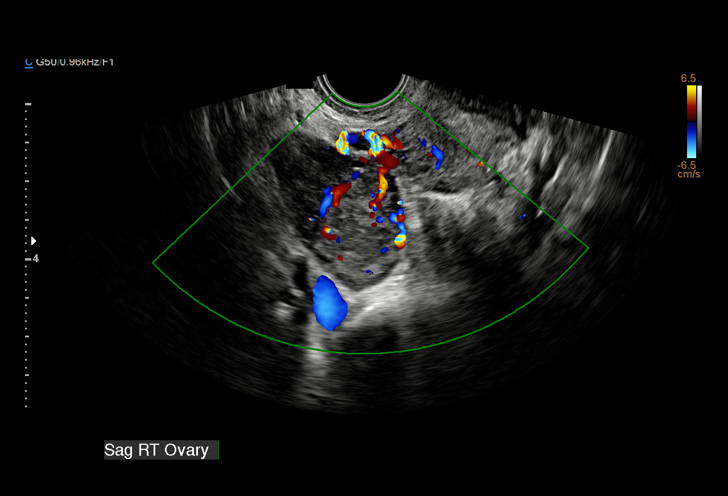
[im 43/47]
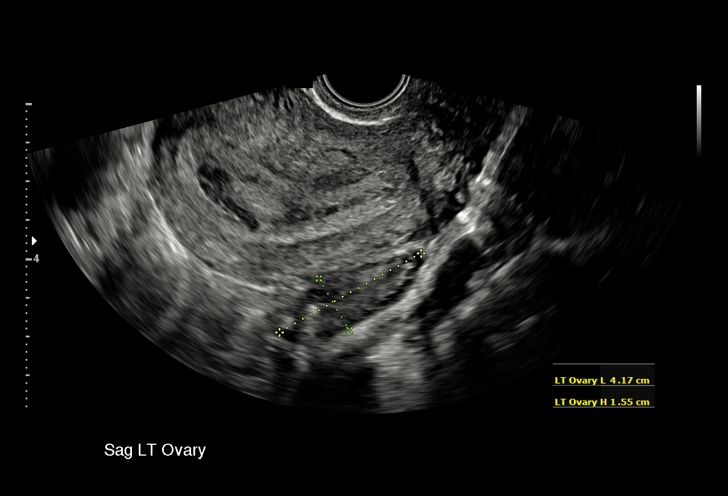
[im 47/47]
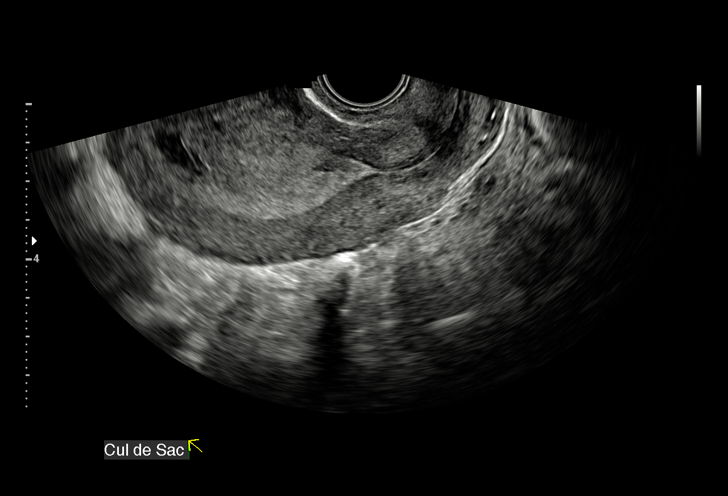

[15 of 28 positions shown; findings below may reference images not displayed]

FINDINGS: Intrauterine gestational sac: Single

Yolk sac:  Visualized.

Embryo:  Not Visualized.

Cardiac Activity: Not Visualized.

MSD: 13.4  mm   6 w   1  d

Subchorionic hemorrhage:  Moderate

Maternal uterus/adnexae: Probable corpus luteum right ovary. Left
ovary is unremarkable. No free fluid in the pelvis.
IMPRESSION: Intrauterine gestational sac and yolk sac but no definite embryo
identified. Recommend follow-up quantitative B-HCG levels and
follow-up US in 14 days to assess viability. This recommendation
follows SRU consensus guidelines: Diagnostic Criteria for Nonviable
Pregnancy Early in the First Trimester. N Engl J Med 1213;
[DATE].

Moderate subchorionic hemorrhage.

## 2018-04-16 ENCOUNTER — Encounter (HOSPITAL_COMMUNITY): Payer: Self-pay

## 2018-04-16 ENCOUNTER — Inpatient Hospital Stay (HOSPITAL_COMMUNITY)
Admission: AD | Admit: 2018-04-16 | Discharge: 2018-04-16 | Disposition: A | Discharge status: Home / Self Care | Payer: Medicaid Other | Source: Ambulatory Visit | Attending: Family Medicine | Admitting: Family Medicine

## 2018-04-16 ENCOUNTER — Other Ambulatory Visit: Payer: Self-pay

## 2018-04-16 DIAGNOSIS — R1031 Right lower quadrant pain: Secondary | ICD-10-CM | POA: Diagnosis present

## 2018-04-16 DIAGNOSIS — Z87891 Personal history of nicotine dependence: Secondary | ICD-10-CM | POA: Insufficient documentation

## 2018-04-16 DIAGNOSIS — N760 Acute vaginitis: Secondary | ICD-10-CM | POA: Insufficient documentation

## 2018-04-16 DIAGNOSIS — B9689 Other specified bacterial agents as the cause of diseases classified elsewhere: Secondary | ICD-10-CM

## 2018-04-16 DIAGNOSIS — A599 Trichomoniasis, unspecified: Secondary | ICD-10-CM | POA: Diagnosis not present

## 2018-04-16 LAB — WET PREP, GENITAL
Sperm: NONE SEEN
Yeast Wet Prep HPF POC: NONE SEEN

## 2018-04-16 LAB — URINALYSIS, ROUTINE W REFLEX MICROSCOPIC
Bilirubin Urine: NEGATIVE
Glucose, UA: NEGATIVE mg/dL
Ketones, ur: NEGATIVE mg/dL
Nitrite: NEGATIVE
Protein, ur: NEGATIVE mg/dL
Specific Gravity, Urine: 1.025 (ref 1.005–1.030)
pH: 6 (ref 5.0–8.0)

## 2018-04-16 LAB — POCT PREGNANCY, URINE: Preg Test, Ur: NEGATIVE

## 2018-04-16 MED ORDER — METRONIDAZOLE 500 MG PO TABS: 500.0000 mg | ORAL_TABLET | Freq: Two times a day (BID) | ORAL | 0 refills | Status: AC

## 2018-04-16 MED ORDER — METRONIDAZOLE 500 MG PO TABS
2000.0000 mg | ORAL_TABLET | Freq: Once | ORAL | Status: AC
  Administered 2018-04-16: 2000 mg via ORAL
  Filled 2018-04-16: qty 4

## 2018-04-16 NOTE — MAU Note (Signed)
Pt feels like she may have BV, having discharge and odor. Also having pain on your right side. 5/10.

## 2018-04-16 NOTE — MAU Provider Note (Signed)
History     CSN: [id number]  Arrival date and time: 04/16/18 1153   First Provider Initiated Contact with Patient 04/16/18 1219      Chief Complaint  Patient presents with  . Abdominal Pain   HPI [name] is a 27 y.o. G2P2001 who presents to MAU today with complaint of foul smelling white, clear discharge x 1 week. She denies vaginal bleeding, N/V/D or fever. She has had RLQ pain since onset of symptoms that is mild.    OB History    Gravida  2   Para  2   Term  2   Preterm  0   AB  0   Living  1     SAB  0   TAB  0   Ectopic  0   Multiple  0   Live Births  2           Past Medical History:  Diagnosis Date  . Headache     Past Surgical History:  Procedure Laterality Date  . CESAREAN SECTION N/A 10/22/2017   Procedure: CESAREAN SECTION;  Surgeon: Viburnum BingPickens, Charlie, MD;  [facility];  Service: Obstetrics;  Laterality: N/A;  . NO PAST SURGERIES      Family History  Problem Relation Age of Onset  . Hypertension Mother     Social History   Tobacco Use  . Smoking status: Former Smoker    Years: 1.00    Types: Cigars    Last attempt to quit: 10/08/2017    Years since quitting: 0.5  . Smokeless tobacco: Never Used  Substance Use Topics  . Alcohol use: No  . Drug use: Yes    Types: Marijuana    Comment: LAST USED LAST WEEK AS OF 10/08/17    Allergies: No Known Allergies  No medications prior to admission.    Review of Systems  Constitutional: Negative for fever.  Gastrointestinal: Negative for abdominal pain, constipation, diarrhea, nausea and vomiting.  Genitourinary: Positive for pelvic pain and vaginal discharge. Negative for vaginal bleeding.   Physical Exam   Blood pressure 116/82, pulse 65, temperature 98.3 F (36.8 C), temperature source Oral, resp. rate 16, height 5\' 3"  (1.6 m), weight 142 lb (64.4 kg), last menstrual period 04/03/2018, SpO2 100 %, unknown if currently breastfeeding.  Physical Exam   Nursing note and vitals reviewed. Constitutional: She is oriented to person, place, and time. She appears well-developed and well-nourished. No distress.  HENT:  Head: Normocephalic and atraumatic.  Cardiovascular: Normal rate.  Respiratory: Effort normal.  GI: Soft. There is tenderness (very mild RLQ tenderness to palpation).  Neurological: She is alert and oriented to person, place, and time.  Skin: Skin is warm and dry. No erythema.  Psychiatric: She has a normal mood and affect.     Results for orders placed or performed during the hospital encounter of 04/16/18 (from the past 24 hour(s))  Urinalysis, Routine w reflex microscopic     Status: Abnormal   Collection Time: 04/16/18 12:05 PM  Result Value Ref Range   Color, Urine YELLOW YELLOW   APPearance HAZY (A) CLEAR   Specific Gravity, Urine 1.025 1.005 - 1.030   pH 6.0 5.0 - 8.0   Glucose, UA NEGATIVE NEGATIVE mg/dL   Hgb urine dipstick SMALL (A) NEGATIVE   Bilirubin Urine NEGATIVE NEGATIVE   Ketones, ur NEGATIVE NEGATIVE mg/dL   Protein, ur NEGATIVE NEGATIVE mg/dL   Nitrite NEGATIVE NEGATIVE   Leukocytes, UA MODERATE (A) NEGATIVE  RBC / HPF 6-30 0 - 5 RBC/hpf   WBC, UA 6-30 0 - 5 WBC/hpf   Bacteria, UA RARE (A) NONE SEEN   Squamous Epithelial / LPF TOO NUMEROUS TO COUNT (A) NONE SEEN   Mucus PRESENT   Pregnancy, urine POC     Status: None   Collection Time: 04/16/18 12:10 PM  Result Value Ref Range   Preg Test, Ur NEGATIVE NEGATIVE  Wet prep, genital     Status: Abnormal   Collection Time: 04/16/18 12:13 PM  Result Value Ref Range   Yeast Wet Prep HPF POC NONE SEEN NONE SEEN   Trich, Wet Prep PRESENT (A) NONE SEEN   Clue Cells Wet Prep HPF POC PRESENT (A) NONE SEEN   WBC, Wet Prep HPF POC FEW (A) NONE SEEN   Sperm NONE SEEN     MAU Course  Procedures None  MDM UPT - negative UA, wet prep, GC/Chlamydia, HIV and RPR today  Patient refused HIV and RPR testing today  2 G Flagyl given in MAU for trichomonas   Assessment and Plan  A: Trichomonas Bacterial vaginosis   P: Discharge home Rx for Flagyl given to patient  Also treated with 2G Flagyl in MAU for trichomonas Partner treatment advised and safe sex practices discussed  Warning signs for worsening condition discussed Patient advised to follow-up with [facility] as needed for routine GYN care Patient may return to MAU as needed or if her condition were to change or worsen  Vonzella Nipple, PA-C 04/16/2018, 1:59 PM

## 2018-04-16 NOTE — Discharge Instructions (Signed)
Bacterial Vaginosis Bacterial vaginosis is an infection of the vagina. It happens when too many germs (bacteria) grow in the vagina. This infection puts you at risk for infections from sex (STIs). Treating this infection can lower your risk for some STIs. You should also treat this if you are pregnant. It can cause your baby to be born early. Follow these instructions at home: Medicines  Take over-the-counter and prescription medicines only as told by your doctor.  Take or use your antibiotic medicine as told by your doctor. Do not stop taking or using it even if you start to feel better. General instructions  If you your sexual partner is a woman, tell her that you have this infection. She needs to get treatment if she has symptoms. If you have a female partner, he does not need to be treated.  During treatment: ? Avoid sex. ? Do not douche. ? Avoid alcohol as told. ? Avoid breastfeeding as told.  Drink enough fluid to keep your pee (urine) clear or pale yellow.  Keep your vagina and butt (rectum) clean. ? Wash the area with warm water every day. ? Wipe from front to back after you use the toilet.  Keep all follow-up visits as told by your doctor. This is important. Preventing this condition  Do not douche.  Use only warm water to wash around your vagina.  Use protection when you have sex. This includes: ? Latex condoms. ? Dental dams.  Limit how many people you have sex with. It is best to only have sex with the same person (be monogamous).  Get tested for STIs. Have your partner get tested.  Wear underwear that is cotton or lined with cotton.  Avoid tight pants and pantyhose. This is most important in summer.  Do not use any products that have nicotine or tobacco in them. These include cigarettes and e-cigarettes. If you need help quitting, ask your doctor.  Do not use illegal drugs.  Limit how much alcohol you drink. Contact a doctor if:  Your symptoms do not get  better, even after you are treated.  You have more discharge or pain when you pee (urinate).  You have a fever.  You have pain in your belly (abdomen).  You have pain with sex.  Your bleed from your vagina between periods. Summary  This infection happens when too many germs (bacteria) grow in the vagina.  Treating this condition can lower your risk for some infections from sex (STIs).  You should also treat this if you are pregnant. It can cause early (premature) birth.  Do not stop taking or using your antibiotic medicine even if you start to feel better. This information is not intended to replace advice given to you by your health care provider. Make sure you discuss any questions you have with your health care provider. Document Released: 09/22/2008 Document Revised: 08/29/2016 Document Reviewed: 08/29/2016 Elsevier Interactive Patient Education  2017 [employer]  Trichomoniasis Trichomoniasis is an STI (sexually transmitted infection) that can affect both women and men. In women, the outer area of the female genitalia (vulva) and the vagina are affected. In men, the penis is mainly affected, but the prostate and other reproductive organs can also be involved. This condition can be treated with medicine. It often has no symptoms (is asymptomatic), especially in men. What are the causes? This condition is caused by an organism called Trichomonas vaginalis. Trichomoniasis most often spreads from person to person (is contagious) through sexual contact. What increases   the risk? The following factors may make you more likely to develop this condition:  Having unprotected sexual intercourse.  Having sexual intercourse with a partner who has trichomoniasis.  Having multiple sexual partners.  Having had previous trichomoniasis infections or other STIs.  What are the signs or symptoms? In women, symptoms of trichomoniasis include:  Abnormal vaginal discharge that is clear,  white, gray, or yellow-green and foamy and has an unusual "fishy" odor.  Itching and irritation of the vagina and vulva.  Burning or pain during urination or sexual intercourse.  Genital redness and swelling.  In men, symptoms of trichomoniasis include:  Penile discharge that may be foamy or contain pus.  Pain in the penis. This may happen only when urinating.  Itching or irritation inside the penis.  Burning after urination or ejaculation.  How is this diagnosed? In women, this condition may be found during a routine Pap test or physical exam. It may be found in men during a routine physical exam. Your health care provider may perform tests to help diagnose this infection, such as:  Urine tests (men and women).  The following in women: ? Testing the pH of the vagina. ? A vaginal swab test that checks for the Trichomonas vaginalis organism. ? Testing vaginal secretions.  Your health care provider may test you for other STIs, including HIV (human immunodeficiency virus). How is this treated? This condition is treated with medicine taken by mouth (orally), such as metronidazole or tinidazole to fight the infection. Your sexual partner(s) may also need to be tested and treated.  If you are a woman and you plan to become pregnant or think you may be pregnant, tell your health care provider right away. Some medicines that are used to treat the infection should not be taken during pregnancy.  Your health care provider may recommend over-the-counter medicines or creams to help relieve itching or irritation. You may be tested for infection again 3 months after treatment. Follow these instructions at home:  Take and use over-the-counter and prescription medicines, including creams, only as told by your health care provider.  Do not have sexual intercourse until one week after you finish your medicine, or until your health care provider approves. Ask your health care provider when you  may resume sexual intercourse.  (Women) Do not douche or wear tampons while you have the infection.  Discuss your infection with your sexual partner(s). Make sure that your partner gets tested and treated, if necessary.  Keep all follow-up visits as told by your health care provider. This is important. How is this prevented?  Use condoms every time you have sex. Using condoms correctly and consistently can help protect against STIs.  Avoid having multiple sexual partners.  Talk with your sexual partner about any symptoms that either of you may have, as well as any history of STIs.  Get tested for STIs and STDs (sexually transmitted diseases) before you have sex. Ask your partner to do the same.  Do not have sexual contact if you have symptoms of trichomoniasis or another STI. Contact a health care provider if:  You still have symptoms after you finish your medicine.  You develop pain in your abdomen.  You have pain when you urinate.  You have bleeding after sexual intercourse.  You develop a rash.  You feel nauseous or you vomit.  You plan to become pregnant or think you may be pregnant. Summary  Trichomoniasis is an STI (sexually transmitted infection) that can affect both   women and men.  This condition often has no symptoms (is asymptomatic), especially in men.  You should not have sexual intercourse until one week after you finish your medicine, or until your health care provider approves. Ask your health care provider when you may resume sexual intercourse.  Discuss your infection with your sexual partner. Make sure that your partner gets tested and treated, if necessary. This information is not intended to replace advice given to you by your health care provider. Make sure you discuss any questions you have with your health care provider. Document Released: 06/09/2001 Document Revised: 11/06/2016 Document Reviewed: 11/06/2016 Elsevier Interactive Patient Education   2017 [employer]vier Inc.  

## 2018-04-16 NOTE — Progress Notes (Addendum)
Nonpregnant here dt foul smelling odor for past week. States had difficulty with sitter issue so came in today dt husband with child.   GC and wet prep blind swab done per order.   1225: provider at bs assessing pt.   Provider at bs reassessing and informed of her two infections in which pt is very upset. Medicated per order by anther nurse.   D/c instructions given with pt understanding. Pt left unit via ambulatory.

## 2018-04-18 LAB — GC/CHLAMYDIA PROBE AMP (~~LOC~~) NOT AT ARMC
Chlamydia: NEGATIVE
Neisseria Gonorrhea: NEGATIVE

## 2018-06-18 ENCOUNTER — Encounter (HOSPITAL_COMMUNITY): Payer: Self-pay | Admitting: Emergency Medicine

## 2018-06-18 ENCOUNTER — Emergency Department (HOSPITAL_COMMUNITY)
Admission: EM | Admit: 2018-06-18 | Discharge: 2018-06-18 | Disposition: A | Discharge status: Home / Self Care | Payer: Medicaid Other | Attending: Emergency Medicine | Admitting: Emergency Medicine

## 2018-06-18 DIAGNOSIS — N898 Other specified noninflammatory disorders of vagina: Secondary | ICD-10-CM

## 2018-06-18 DIAGNOSIS — B9689 Other specified bacterial agents as the cause of diseases classified elsewhere: Secondary | ICD-10-CM | POA: Insufficient documentation

## 2018-06-18 DIAGNOSIS — R1031 Right lower quadrant pain: Secondary | ICD-10-CM | POA: Insufficient documentation

## 2018-06-18 DIAGNOSIS — Z79899 Other long term (current) drug therapy: Secondary | ICD-10-CM | POA: Insufficient documentation

## 2018-06-18 DIAGNOSIS — N760 Acute vaginitis: Secondary | ICD-10-CM | POA: Insufficient documentation

## 2018-06-18 DIAGNOSIS — F17290 Nicotine dependence, other tobacco product, uncomplicated: Secondary | ICD-10-CM | POA: Insufficient documentation

## 2018-06-18 LAB — WET PREP, GENITAL
Sperm: NONE SEEN
Trich, Wet Prep: NONE SEEN
Yeast Wet Prep HPF POC: NONE SEEN

## 2018-06-18 LAB — URINALYSIS, ROUTINE W REFLEX MICROSCOPIC
Bilirubin Urine: NEGATIVE
Glucose, UA: NEGATIVE mg/dL
Ketones, ur: NEGATIVE mg/dL
Nitrite: NEGATIVE
Protein, ur: NEGATIVE mg/dL
Specific Gravity, Urine: 1.017 (ref 1.005–1.030)
pH: 6 (ref 5.0–8.0)

## 2018-06-18 LAB — PREGNANCY, URINE: Preg Test, Ur: NEGATIVE

## 2018-06-18 MED ORDER — STERILE WATER FOR INJECTION IJ SOLN
INTRAMUSCULAR | Status: AC
  Administered 2018-06-18: 17:00:00
  Filled 2018-06-18: qty 10

## 2018-06-18 MED ORDER — METRONIDAZOLE 500 MG PO TABS: 500.0000 mg | ORAL_TABLET | Freq: Two times a day (BID) | ORAL | 0 refills | Status: DC

## 2018-06-18 MED ORDER — CEFTRIAXONE SODIUM 250 MG IJ SOLR
250.0000 mg | Freq: Once | INTRAMUSCULAR | Status: AC
  Administered 2018-06-18: 250 mg via INTRAMUSCULAR
  Filled 2018-06-18: qty 250

## 2018-06-18 MED ORDER — DOXYCYCLINE HYCLATE 100 MG PO CAPS: 100.0000 mg | ORAL_CAPSULE | Freq: Two times a day (BID) | ORAL | 0 refills | Status: AC

## 2018-06-18 NOTE — ED Notes (Signed)
Pt has politely declined blood draw, has expressed this to the care team.

## 2018-06-18 NOTE — Discharge Instructions (Addendum)
Please take entire course of antibiotics as directed you are being treated for bacterial vaginosis and possible pelvic inflammatory disease, taking these antibiotics is much harmful than potential pelvic inflammatory disease that goes untreated.  You will be contacted in 2 to 3 days with any positive results from your STD testing, you were not tested for syphilis or HIV today without lab work, please use protection.  Without lab work I cannot assure that you do not have appendicitis or another acute problem with your abdomen.  Please return to the emergency department if you have worsening pain, fevers or chills, persistent vomiting, worsening pelvic pain or discharge or any other new or concerning symptoms.

## 2018-06-18 NOTE — ED Provider Notes (Signed)
[facility]-EMERGENCY DEPT Provider Note   CSN: [id number] Arrival date & time: 06/18/18  1440     History   Chief Complaint Chief Complaint  Patient presents with  . Abdominal Pain  . Vaginal Discharge    HPI Cynthia Hull is a 27 y.o. female.  Cynthia Hull is a 27 y.o. Female with a history of headaches, who presents to the emergency department for evaluation of intermittent right lower quadrant abdominal pain and vaginal discharge.  Symptoms have been present over the past week.  She reports intermittent pains in the right lower quadrant of feel more pelvic than abdominal, pain comes occasionally usually lasts about 3 minutes and is sharp and then goes away on its own without intervention.  She reports this feels similar to when she was diagnosed with BV in the past.  She reports associated whitish-gray vaginal discharge with foul odor, has history of recurrent BV but also found out that her boyfriend has cheated on her and wants to make sure there are no other STDs.  Patient denies any associated nausea or vomiting, no fevers or chills.  She denies any dysuria or urinary frequency.  No diarrhea, constipation, melena or hematochezia.  Normal appetite.  Patient politely declines blood work, she reports she is extremely afraid of needles and would just like a pelvic exam to check for BV or STDs at this time.     Past Medical History:  Diagnosis Date  . Headache     Patient Active Problem List   Diagnosis Date Noted  . Engorgement of breast associated with childbirth, postpartum 11/11/2017  . Pregnancy with history of neonatal death 05/01/17  . Low grade squamous intraepithelial lesion (LGSIL) on Papanicolaou smear of cervix 03/30/2016    Past Surgical History:  Procedure Laterality Date  . CESAREAN SECTION N/A 10/22/2017   Procedure: CESAREAN SECTION;  Surgeon: Hurricane Bing, MD;  Location: [facility] BIRTHING SUITES;  Service: Obstetrics;  Laterality: N/A;    . NO PAST SURGERIES       OB History    Gravida  2   Para  2   Term  2   Preterm  0   AB  0   Living  1     SAB  0   TAB  0   Ectopic  0   Multiple  0   Live Births  2            Home Medications    Prior to Admission medications   Medication Sig Start Date End Date Taking? Authorizing Provider  acetaminophen (TYLENOL) 500 MG tablet Take 500-1,000 mg by mouth every 6 (six) hours as needed for moderate pain or headache.     [provider]  ferrous sulfate 325 (65 FE) MG tablet Take 325 mg 2 (two) times daily with a meal by mouth.    [provider]  ibuprofen (ADVIL,MOTRIN) 600 MG tablet Take 1 tablet (600 mg total) by mouth every 6 (six) hours. Patient not taking: Reported on 11/30/2017 10/24/17   Pincus Large, DO  Prenatal Vit-Fe Fumarate-FA (PRENATAL MULTIVITAMIN) TABS tablet Take 1 tablet by mouth daily.    [provider]    Family History Family History  Problem Relation Age of Onset  . Hypertension Mother     Social History Social History   Tobacco Use  . Smoking status: Current Every Day Smoker    Years: 1.00    Types: Cigars  . Smokeless tobacco: Never Used  Substance Use Topics  . Alcohol use: No  . Drug use: Yes    Types: Marijuana    Comment: LAST USED LAST WEEK AS OF 10/08/17     Allergies   Patient has no known allergies.   Review of Systems Review of Systems  Constitutional: Negative for chills and fever.  HENT: Negative.   Eyes: Negative for visual disturbance.  Respiratory: Negative for cough and shortness of breath.   Gastrointestinal: Positive for abdominal pain. Negative for blood in stool, constipation, diarrhea, nausea and vomiting.  Genitourinary: Positive for pelvic pain and vaginal discharge. Negative for dysuria, flank pain, frequency, vaginal bleeding and vaginal pain.  Musculoskeletal: Negative for arthralgias and myalgias.  Skin: Negative for color change and rash.   Neurological: Negative for dizziness, syncope and light-headedness.     Physical Exam Updated Vital Signs BP 129/62   Pulse 79   Temp 98.3 F (36.8 C) (Oral)   Resp 18   LMP 06/04/2018   SpO2 100%   Physical Exam  Constitutional: She appears well-developed and well-nourished. No distress.  HENT:  Head: Normocephalic and atraumatic.  Eyes: Pupils are equal, round, and reactive to light. EOM are normal. Right eye exhibits no discharge. Left eye exhibits no discharge.  Cardiovascular: Normal rate, regular rhythm, normal heart sounds and intact distal pulses.  Pulmonary/Chest: Effort normal and breath sounds normal. No stridor. No respiratory distress. She has no wheezes. She has no rhonchi. She has no rales.  Respirations equal and unlabored, patient able to speak in full sentences, lungs clear to auscultation bilaterally  Abdominal: Soft. Normal appearance and bowel sounds are normal. There is tenderness in the right lower quadrant. There is no rigidity, no rebound, no guarding, no CVA tenderness, no tenderness at McBurney's point and negative Murphy's sign.  Abdomen soft, nondistended, bowel sounds present throughout, there is mild tenderness without guarding in the right lower quadrant, no specific tenderness at McBurney's point, no rebound tenderness, all other quadrants nontender to palpation, no CVA tenderness  Genitourinary:  Genitourinary Comments: Chaperone present during pelvic exam No external genital lesions noted There is a moderate amount of whitish-gray discharge present in the vaginal vault and coming from the cervix, cervical office is closed, there is no cervical motion tenderness, no uterine tenderness, bilateral adnexa without masses, very minimal right adnexal tenderness, no left adnexal tenderness  Neurological: She is alert. Coordination normal.  Skin: Skin is warm and dry. Capillary refill takes less than 2 seconds. She is not diaphoretic.  Psychiatric: She has a  normal mood and affect. Her behavior is normal.  Nursing note and vitals reviewed.    ED Treatments / Results  Labs (all labs ordered are listed, but only abnormal results are displayed) Labs Reviewed  WET PREP, GENITAL - Abnormal; Notable for the following components:      Result Value   Clue Cells Wet Prep HPF POC PRESENT (*)    WBC, Wet Prep HPF POC MANY (*)    All other components within normal limits  URINALYSIS, ROUTINE W REFLEX MICROSCOPIC - Abnormal; Notable for the following components:   APPearance HAZY (*)    Hgb urine dipstick SMALL (*)    Leukocytes, UA LARGE (*)    Bacteria, UA RARE (*)    All other components within normal limits  PREGNANCY, URINE  GC/CHLAMYDIA PROBE AMP ([place]) NOT AT [facility]    EKG None  Radiology No results found.  Procedures Procedures (including critical care time)  Medications Ordered in ED  Medications  cefTRIAXone (ROCEPHIN) injection 250 mg (has no administration in time range)     Initial Impression / Assessment and Plan / ED Course  I have reviewed the triage vital signs and the nursing notes.  Pertinent labs & imaging results that were available during my care of the patient were reviewed by me and considered in my medical decision making (see chart for details).  Patient presents to the emergency department for evaluation of vaginal discharge and right lower quadrant abdominal and pelvic pain.  Symptoms have been present intermittently for the past week.  Has history of recurrent BV, also found out her boyfriend recently cheated on her.  Patient is requesting STD testing, but declines any blood work or IV.  On initial evaluation patient with normal vitals.  She does have some mild tenderness in the right lower quadrant without guarding.  On pelvic exam patient has copious white-gray discharge, no cervical motion tenderness, very mild right adnexal tenderness.  GC chlamydia swab and wet prep sent as well as urinalysis and urine  pregnancy.  Had long discussion with patient regarding desire to do blood work to look for leukocytosis, normal liver and kidney function, and to test for HIV and syphilis to help better differentiate what is going on because BV very rarely causes pain.  Patient continues to politely refuse blood work at this time.  Urinalysis shows large leukocytes with rare bacteria, wet prep significant for clue cells and many WBCs.  Given patient's potential exposure to STDs, will treat with ceftriaxone and given that she does have some mild right adnexal tenderness and will not allow any further evaluation out of caution we will treat for PID with 2 weeks of doxycycline.  Prescription for Flagyl provided for BV.  She is aware she has GC and Chlamydia cultures pending and will be contacted in 2 to 3 days with any positive results.  Encouraged her to use protection she is aware that she was not tested for HIV or syphilis today.  Again had discussion with patient regarding inability to determine if there is any other cause for her pain without lab work and potential imaging.  She continues to decline any blood work or IV.  Discussed potential risks including worsening pain and infection, death or disability, patient expresses understanding she will sign out AGAINST MEDICAL ADVICE.  Encourage patient to return at any time for continued evaluation, specific return precautions discussed.  Patient expresses understanding and is in agreement with plan.  Final Clinical Impressions(s) / ED Diagnoses   Final diagnoses:  Right lower quadrant abdominal pain  Vaginal discharge    ED Discharge Orders        Ordered    doxycycline (VIBRAMYCIN) 100 MG capsule  2 times daily     06/18/18 1649    metroNIDAZOLE (FLAGYL) 500 MG tablet  2 times daily     06/18/18 1649       Dartha Lodge, [place] 06/18/18 1658    Rolland Porter, MD 06/24/18 [phone]

## 2018-06-18 NOTE — ED Triage Notes (Signed)
Pt reports RLQ abd pains for 2 days also having white-greyish vaginal discharge with foul odor. reports has recurrent BV but recently found out her boyfriend cheated on her wants to make sure no other STDs.

## 2018-06-18 NOTE — ED Notes (Signed)
Pt reports not being comfortable with having blood drawn, states that she does not want NT or RN to try.

## 2018-06-18 NOTE — ED Notes (Signed)
Pelvic cart at bedside. 

## 2018-06-20 LAB — GC/CHLAMYDIA PROBE AMP (~~LOC~~) NOT AT ARMC
Chlamydia: NEGATIVE
Neisseria Gonorrhea: NEGATIVE

## 2019-01-07 ENCOUNTER — Encounter (HOSPITAL_COMMUNITY): Payer: Self-pay | Admitting: Emergency Medicine

## 2019-01-07 ENCOUNTER — Emergency Department (HOSPITAL_COMMUNITY)
Admission: EM | Admit: 2019-01-07 | Discharge: 2019-01-07 | Disposition: A | Discharge status: Home / Self Care | Payer: Medicaid Other | Attending: Emergency Medicine | Admitting: Emergency Medicine

## 2019-01-07 DIAGNOSIS — F17210 Nicotine dependence, cigarettes, uncomplicated: Secondary | ICD-10-CM | POA: Diagnosis not present

## 2019-01-07 DIAGNOSIS — N898 Other specified noninflammatory disorders of vagina: Secondary | ICD-10-CM | POA: Insufficient documentation

## 2019-01-07 LAB — WET PREP, GENITAL
Clue Cells Wet Prep HPF POC: NONE SEEN
Sperm: NONE SEEN
Trich, Wet Prep: NONE SEEN
Yeast Wet Prep HPF POC: NONE SEEN

## 2019-01-07 MED ORDER — METRONIDAZOLE 500 MG PO TABS: 500.0000 mg | ORAL_TABLET | Freq: Two times a day (BID) | ORAL | 0 refills | Status: DC

## 2019-01-07 NOTE — Discharge Instructions (Signed)
Please take all of your antibiotics until finished!   You may develop abdominal discomfort or diarrhea from the antibiotic.  You may help offset this with probiotics which you can buy or get in yogurt. Do not eat  or take the probiotics until 2 hours after your antibiotic.  Do not drink any alcohol while on this antibiotic.  Follow-up with your primary care physician or OB/GYN for reevaluation of your abnormal vaginal discharge.  Return to the emergency department if any concerning signs or symptoms develop.

## 2019-01-07 NOTE — ED Triage Notes (Signed)
Pt c/o vaginal itching, foul odor and vaginal discharge for 2 weeks. Reports that her menstrual was last week.

## 2019-01-07 NOTE — ED Provider Notes (Signed)
[facility]-EMERGENCY DEPT Provider Note   CSN: [id number] Arrival date & time: 01/07/19  1113     History   Chief Complaint Chief Complaint  Patient presents with  . Vaginal Discharge    HPI Cynthia Hull is a 28 y.o. female presents for evaluation of acute onset, persistent vaginal discharge for 2 weeks.  She notes thick white discharge with a different odor.  Notes she is prone to BV and states this feels consistent.  She notes very mild lower abdominal pain which she describes as an ache and is intermittent but states "I am not worried about it ".  Denies urinary symptoms, diarrhea, constipation, melena, medicated, fevers, chills, nausea, or vomiting.  Has not tried anything for her symptoms.  Reports she has not been sexually active in over a month and does not want any HIV or syphilis testing today.  The history is provided by the patient.    Past Medical History:  Diagnosis Date  . Headache     Patient Active Problem List   Diagnosis Date Noted  . Engorgement of breast associated with childbirth, postpartum 11/11/2017  . Pregnancy with history of neonatal death 04-20-2017  . Low grade squamous intraepithelial lesion (LGSIL) on Papanicolaou smear of cervix 03/30/2016    Past Surgical History:  Procedure Laterality Date  . CESAREAN SECTION N/A 10/22/2017   Procedure: CESAREAN SECTION;  Surgeon: Sidney Bing, MD;  Location: [facility] BIRTHING SUITES;  Service: Obstetrics;  Laterality: N/A;  . NO PAST SURGERIES       OB History    Gravida  2   Para  2   Term  2   Preterm  0   AB  0   Living  1     SAB  0   TAB  0   Ectopic  0   Multiple  0   Live Births  2            Home Medications    Prior to Admission medications   Medication Sig Start Date End Date Taking? Authorizing Provider  ibuprofen (ADVIL,MOTRIN) 600 MG tablet Take 1 tablet (600 mg total) by mouth every 6 (six) hours. Patient not taking: Reported on 11/30/2017  10/24/17   Pincus Large, DO  metroNIDAZOLE (FLAGYL) 500 MG tablet Take 1 tablet (500 mg total) by mouth 2 (two) times daily. 01/07/19   Jeanie Sewer, PA-C    Family History Family History  Problem Relation Age of Onset  . Hypertension Mother     Social History Social History   Tobacco Use  . Smoking status: Current Every Day Smoker    Years: 1.00    Types: Cigars  . Smokeless tobacco: Never Used  Substance Use Topics  . Alcohol use: No  . Drug use: Yes    Types: Marijuana    Comment: LAST USED LAST WEEK AS OF 10/08/17     Allergies   Patient has no known allergies.   Review of Systems Review of Systems  Constitutional: Negative for chills and fever.  Respiratory: Negative for shortness of breath.   Cardiovascular: Negative for chest pain.  Gastrointestinal: Negative for blood in stool, constipation, diarrhea, nausea and vomiting.  Genitourinary: Positive for pelvic pain (mild) and vaginal discharge. Negative for vaginal bleeding and vaginal pain.  All other systems reviewed and are negative.    Physical Exam Updated Vital Signs BP 137/74 (BP Location: Left Arm)   Pulse 90   Temp 98.4 F (36.9 C) (  Oral)   Resp 18   LMP 12/31/2018   SpO2 100%   Physical Exam Vitals signs and nursing note reviewed. Exam conducted with a chaperone present.  Constitutional:      General: She is not in acute distress.    Appearance: She is well-developed.  HENT:     Head: Normocephalic and atraumatic.  Eyes:     General:        Right eye: No discharge.        Left eye: No discharge.     Conjunctiva/sclera: Conjunctivae normal.  Neck:     Vascular: No JVD.     Trachea: No tracheal deviation.  Cardiovascular:     Rate and Rhythm: Normal rate.     Heart sounds: Normal heart sounds.  Pulmonary:     Effort: Pulmonary effort is normal.     Breath sounds: Normal breath sounds.  Abdominal:     General: Abdomen is flat. Bowel sounds are normal. There is no distension.      Palpations: Abdomen is soft.     Tenderness: There is no abdominal tenderness. There is no guarding or rebound.  Genitourinary:    Exam position: Lithotomy position.     Vagina: Vaginal discharge present. No tenderness or bleeding.     Cervix: Normal.     Uterus: Not tender.      Adnexa:        Right: No tenderness or fullness.         Left: No tenderness or fullness.       Comments: Examination performed in the presence of a chaperone.  No masses or lesions to the external genitalia.  Moderate amount of thick white discharge in the vaginal vault.  No cervical motion tenderness or adnexal tenderness. Skin:    General: Skin is warm and dry.     Findings: No erythema.  Neurological:     Mental Status: She is alert.  Psychiatric:        Behavior: Behavior normal.      ED Treatments / Results  Labs (all labs ordered are listed, but only abnormal results are displayed) Labs Reviewed  WET PREP, GENITAL - Abnormal; Notable for the following components:      Result Value   WBC, Wet Prep HPF POC MANY (*)    All other components within normal limits  GC/CHLAMYDIA PROBE AMP ([place]) NOT AT [facility]    EKG None  Radiology No results found.  Procedures Procedures (including critical care time)  Medications Ordered in ED Medications - No data to display   Initial Impression / Assessment and Plan / ED Course  I have reviewed the triage vital signs and the nursing notes.  Pertinent labs & imaging results that were available during my care of the patient were reviewed by me and considered in my medical decision making (see chart for details).    Patient presenting for evaluation of abnormal vaginal discharge for approximately 2 weeks after completing her cycle.  She is afebrile, vital signs are stable.  She is nontoxic in appearance.  Abdomen is soft and nontender.  No concern for PID, ectopic pregnancy, ovarian torsion, TOA.  Doubt acute surgical abdominal pathology.  She  declines HIV or syphilis testing today.  Her wet prep shows many WBCs but no evidence of yeast or trichomoniasis.  No clue cells on wet prep however patient is insistent that her symptoms are consistent with her usual BV flares.  I did recommend follow-up with OB/GYN  or PCP for reevaluation of recurrent BV.  Discussed strict ED return precautions. Pt verbalized understanding of and agreement with plan and is safe for discharge home at this time.   Final Clinical Impressions(s) / ED Diagnoses   Final diagnoses:  Vaginal discharge    ED Discharge Orders         Ordered    metroNIDAZOLE (FLAGYL) 500 MG tablet  2 times daily     01/07/19 1309           Jeanie Sewer, PA-C 01/07/19 1311    Mancel Bale, MD 01/07/19 1731

## 2019-01-09 LAB — GC/CHLAMYDIA PROBE AMP (~~LOC~~) NOT AT ARMC
Chlamydia: NEGATIVE
Neisseria Gonorrhea: NEGATIVE

## 2019-03-13 ENCOUNTER — Inpatient Hospital Stay (EMERGENCY_DEPARTMENT_HOSPITAL)
Admission: AD | Admit: 2019-03-13 | Discharge: 2019-03-13 | Disposition: A | Discharge status: Home / Self Care | Payer: Medicaid Other | Source: Home / Self Care | Attending: Obstetrics and Gynecology | Admitting: Obstetrics and Gynecology

## 2019-03-13 ENCOUNTER — Encounter (HOSPITAL_COMMUNITY): Payer: Self-pay | Admitting: Emergency Medicine

## 2019-03-13 ENCOUNTER — Inpatient Hospital Stay (HOSPITAL_COMMUNITY)
Admission: AD | Admit: 2019-03-13 | Discharge: 2019-03-13 | Disposition: A | Discharge status: Home / Self Care | Payer: Medicaid Other | Source: Home / Self Care | Attending: Obstetrics and Gynecology | Admitting: Obstetrics and Gynecology

## 2019-03-13 ENCOUNTER — Encounter (HOSPITAL_COMMUNITY): Payer: Self-pay | Admitting: *Deleted

## 2019-03-13 ENCOUNTER — Other Ambulatory Visit: Payer: Self-pay

## 2019-03-13 ENCOUNTER — Emergency Department (HOSPITAL_COMMUNITY)
Admission: EM | Admit: 2019-03-13 | Discharge: 2019-03-13 | Disposition: A | Discharge status: Home / Self Care | Payer: Medicaid Other | Attending: Emergency Medicine | Admitting: Emergency Medicine

## 2019-03-13 ENCOUNTER — Inpatient Hospital Stay (HOSPITAL_COMMUNITY): Payer: Medicaid Other

## 2019-03-13 DIAGNOSIS — O99331 Smoking (tobacco) complicating pregnancy, first trimester: Secondary | ICD-10-CM

## 2019-03-13 DIAGNOSIS — O209 Hemorrhage in early pregnancy, unspecified: Secondary | ICD-10-CM | POA: Insufficient documentation

## 2019-03-13 DIAGNOSIS — Z3A00 Weeks of gestation of pregnancy not specified: Secondary | ICD-10-CM | POA: Insufficient documentation

## 2019-03-13 DIAGNOSIS — O4691 Antepartum hemorrhage, unspecified, first trimester: Secondary | ICD-10-CM

## 2019-03-13 DIAGNOSIS — O360911 Maternal care for other rhesus isoimmunization, first trimester, fetus 1: Secondary | ICD-10-CM

## 2019-03-13 DIAGNOSIS — Z6790 Unspecified blood type, Rh positive: Secondary | ICD-10-CM

## 2019-03-13 DIAGNOSIS — F17290 Nicotine dependence, other tobacco product, uncomplicated: Secondary | ICD-10-CM

## 2019-03-13 DIAGNOSIS — Z3A01 Less than 8 weeks gestation of pregnancy: Secondary | ICD-10-CM

## 2019-03-13 DIAGNOSIS — O4690 Antepartum hemorrhage, unspecified, unspecified trimester: Secondary | ICD-10-CM

## 2019-03-13 LAB — COMPREHENSIVE METABOLIC PANEL
ALT: 12 U/L (ref 0–44)
AST: 7 U/L — ABNORMAL LOW (ref 15–41)
Albumin: 3.3 g/dL — ABNORMAL LOW (ref 3.5–5.0)
Alkaline Phosphatase: 48 U/L (ref 38–126)
Anion gap: 10 (ref 5–15)
BUN: <5 mg/dL — ABNORMAL LOW (ref 6–20)
CO2: 23 mmol/L (ref 22–32)
Calcium: 8.4 mg/dL — ABNORMAL LOW (ref 8.9–10.3)
Chloride: 103 mmol/L (ref 98–111)
Creatinine, Ser: 0.72 mg/dL (ref 0.44–1.00)
GFR calc Af Amer: >60 mL/min (ref >60)
GFR calc non Af Amer: >60 mL/min (ref >60)
Glucose, Bld: 91 mg/dL (ref 70–99)
Potassium: 3.4 mmol/L — ABNORMAL LOW (ref 3.5–5.1)
Sodium: 136 mmol/L (ref 135–145)
Total Bilirubin: 0.5 mg/dL (ref 0.3–1.2)
Total Protein: 6.8 g/dL (ref 6.5–8.1)

## 2019-03-13 LAB — CBC
HCT: 35.6 % — ABNORMAL LOW (ref 36.0–46.0)
Hemoglobin: 11.8 g/dL — ABNORMAL LOW (ref 12.0–15.0)
MCH: 27.4 pg (ref 26.0–34.0)
MCHC: 33.1 g/dL (ref 30.0–36.0)
MCV: 82.8 fL (ref 80.0–100.0)
Platelets: 324 10*3/uL (ref 150–400)
RBC: 4.3 MIL/uL (ref 3.87–5.11)
RDW: 18.4 % — ABNORMAL HIGH (ref 11.5–15.5)
WBC: 8.9 10*3/uL (ref 4.0–10.5)
nRBC: 0 % (ref 0.0–0.2)

## 2019-03-13 LAB — URINALYSIS, ROUTINE W REFLEX MICROSCOPIC
Bilirubin Urine: NEGATIVE
Glucose, UA: NEGATIVE mg/dL
Ketones, ur: NEGATIVE mg/dL
Leukocytes,Ua: NEGATIVE
Nitrite: NEGATIVE
Protein, ur: NEGATIVE mg/dL
Specific Gravity, Urine: 1.016 (ref 1.005–1.030)
pH: 7 (ref 5.0–8.0)

## 2019-03-13 LAB — HCG, QUANTITATIVE, PREGNANCY: hCG, Beta Chain, Quant, S: 60026 m[IU]/mL — ABNORMAL HIGH (ref <5)

## 2019-03-13 LAB — POC URINE PREG, ED: Preg Test, Ur: POSITIVE — AB

## 2019-03-13 LAB — ABO/RH: ABO/RH(D): O POS

## 2019-03-13 NOTE — ED Triage Notes (Signed)
Patient reports + at-home pregnancy test about 2 weeks ago and has had vaginal bleeding with clots and lower abdominal cramping since last night.

## 2019-03-13 NOTE — ED Provider Notes (Signed)
Patient placed in Quick Look pathway, seen and evaluated   Chief Complaint: Lower abdominal pain and vaginal bleeding  HPI:   28 year old female, G4, P1, presents approximately [redacted] weeks pregnant with vaginal bleeding and lower abdominal pain that started last night.  Patient states she passed several large clots last night.  She started having lower abdominal cramping.  She notes some intermittent nausea and vomiting.  She denies any associated vaginal discharge.  She denies any fevers, chills.  Patient has not had an ultrasound to confirm intrauterine pregnancy.   ROS: + Vaginal bleeding, abdominal pain   Physical Exam:   Gen: No distress  Neuro: Awake and Alert  Skin: Warm    Focused Exam: Mild suprapubic TTP    Lungs CTAB    Heart regular rate and rhythm, without murmur    Initiation of care has begun. The patient has been counseled on the process, plan, and necessity for staying for the completion/evaluation, and the remainder of the medical screening examination  Spoke with MAU APP: Joni Reining.  Gave report to her and she agreed to see the patient.  Will get patient over to them.  Patient is hemodynamically stable, resting comfortably in chair, no acute distress, nontoxic, non-lethargic.  She is ready and stable for transport to the MAU.   Clayborne Artist, PA-C 03/13/19 1627    Margarita Grizzle, MD 03/19/19 [phone]

## 2019-03-13 NOTE — ED Notes (Signed)
Transport paged to take patient to MAU per EDP request.

## 2019-03-13 NOTE — MAU Note (Signed)
Pt Presents to MAU with complaints of vaginal bleeding that started last night. Lower abdominal cramping

## 2019-03-13 NOTE — Discharge Instructions (Addendum)
Please report directly to the MAU for continued evaluation at this time.

## 2019-03-13 NOTE — MAU Provider Note (Signed)
History     CSN: [id number]  Arrival date and time: 03/13/19 1232   First Provider Initiated Contact with Patient 03/13/19 1308      No chief complaint on file.  Ms. Robie Eccleston is a 28 y.o. G3P2001 at [redacted]w[redacted]d who presents to MAU for vaginal bleeding which began last night (03/12/2019) around 2100. Pt reports she had active bleeding down her legs along with a single, large clot that passed last night along with pelvic cramping. Pt reports today that the bleeding is minimal (only spotting), but the cramping has not subsided. Pt reports cramping is no worse than a period.  Blood soaking clothes? no Lightheaded/dizzy? no Passed any tissue? no Blood Type? unknown Allergies? NKDA Current medications? none Current PNC & next appt? none, pt has not established care    OB History    Gravida  3   Para  2   Term  2   Preterm  0   AB  0   Living  1     SAB  0   TAB  0   Ectopic  0   Multiple  0   Live Births  2           Past Medical History:  Diagnosis Date  . Headache     Past Surgical History:  Procedure Laterality Date  . CESAREAN SECTION N/A 10/22/2017   Procedure: CESAREAN SECTION;  Surgeon: [place] Bing, MD;  Location: [facility] BIRTHING SUITES;  Service: Obstetrics;  Laterality: N/A;  . NO PAST SURGERIES      Family History  Problem Relation Age of Onset  . Hypertension Mother     Social History   Tobacco Use  . Smoking status: Current Every Day Smoker    Years: 1.00    Types: Cigars  . Smokeless tobacco: Never Used  Substance Use Topics  . Alcohol use: No  . Drug use: Yes    Types: Marijuana    Comment: March 2020    Allergies: No Known Allergies  Medications Prior to Admission  Medication Sig Dispense Refill Last Dose  . ibuprofen (ADVIL,MOTRIN) 600 MG tablet Take 1 tablet (600 mg total) by mouth every 6 (six) hours. (Patient not taking: Reported on 11/30/2017) 30 tablet 0 Not Taking at Unknown time  . metroNIDAZOLE (FLAGYL) 500 MG  tablet Take 1 tablet (500 mg total) by mouth 2 (two) times daily. 14 tablet 0     Review of Systems  Constitutional: Negative for chills, fatigue and fever.  Respiratory: Negative for shortness of breath.   Cardiovascular: Negative for chest pain.  Gastrointestinal: Negative for abdominal pain, constipation, diarrhea, nausea and vomiting.  Genitourinary: Positive for pelvic pain and vaginal bleeding. Negative for dysuria, flank pain, urgency and vaginal discharge.  Neurological: Negative for dizziness, weakness, light-headedness and headaches.   Physical Exam   Blood pressure 122/64, pulse 71, temperature 98.4 F (36.9 C), temperature source Oral, resp. rate 18, height 5\' 3"  (1.6 m), weight 62.1 kg, last menstrual period 01/29/2019, SpO2 100 %, unknown if currently breastfeeding.  Patient Vitals for the past 24 hrs:  BP Temp Temp src Pulse Resp SpO2 Height Weight  03/13/19 1654 122/64 - - 71 - - - -  03/13/19 1645 (!) 116/97 98.4 F (36.9 C) Oral 70 18 100 % - -  03/13/19 1249 (!) 130/59 - - 70 16 100 % - -  03/13/19 1247 - - - - - - 5\' 3"  (1.6 m) 62.1 kg    Physical Exam  Constitutional: She is oriented to person, place, and time. She appears well-developed and well-nourished. No distress.  HENT:  Head: Normocephalic.  Respiratory: Effort normal.  GI: Soft. She exhibits no distension and no mass. There is abdominal tenderness in the right upper quadrant and suprapubic area. There is no rebound and no guarding.  Genitourinary:    Uterus normal.  Cervix exhibits no motion tenderness, no discharge and no friability. Right adnexum displays no mass, no tenderness and no fullness. Left adnexum displays no mass, no tenderness and no fullness.    Vaginal bleeding (brownish-reddish-tinged discharge) present.     No vaginal discharge.  There is bleeding (brownish-reddish-tinged discharge) in the vagina.  Neurological: She is alert and oriented to person, place, and time.  Skin: Skin is  warm and dry. She is not diaphoretic.  Psychiatric: She has a normal mood and affect. Her behavior is normal.   Results for orders placed or performed during the hospital encounter of 03/13/19 (from the past 24 hour(s))  CBC     Status: Abnormal   Collection Time: 03/13/19  1:43 PM  Result Value Ref Range   WBC 8.9 4.0 - 10.5 K/uL   RBC 4.30 3.87 - 5.11 MIL/uL   Hemoglobin 11.8 (L) 12.0 - 15.0 g/dL   HCT 16.1 (L) 09.6 - 04.5 %   MCV 82.8 80.0 - 100.0 fL   MCH 27.4 26.0 - 34.0 pg   MCHC 33.1 30.0 - 36.0 g/dL   RDW 40.9 (H) 81.1 - 91.4 %   Platelets 324 150 - 400 K/uL   nRBC 0.0 0.0 - 0.2 %  Comprehensive metabolic panel     Status: Abnormal   Collection Time: 03/13/19  1:43 PM  Result Value Ref Range   Sodium 136 135 - 145 mmol/L   Potassium 3.4 (L) 3.5 - 5.1 mmol/L   Chloride 103 98 - 111 mmol/L   CO2 23 22 - 32 mmol/L   Glucose, Bld 91 70 - 99 mg/dL   BUN <5 (L) 6 - 20 mg/dL   Creatinine, Ser 7.82 0.44 - 1.00 mg/dL   Calcium 8.4 (L) 8.9 - 10.3 mg/dL   Total Protein 6.8 6.5 - 8.1 g/dL   Albumin 3.3 (L) 3.5 - 5.0 g/dL   AST 7 (L) 15 - 41 U/L   ALT 12 0 - 44 U/L   Alkaline Phosphatase 48 38 - 126 U/L   Total Bilirubin 0.5 0.3 - 1.2 mg/dL   GFR calc non Af Amer >60 >60 mL/min   GFR calc Af Amer >60 >60 mL/min   Anion gap 10 5 - 15  ABO/Rh     Status: None   Collection Time: 03/13/19  1:43 PM  Result Value Ref Range   ABO/RH(D) O POS    No rh immune globuloin      NOT A RH IMMUNE GLOBULIN CANDIDATE, PT RH POSITIVE Performed at [facility], [address], [place], [place] [address]   hCG, quantitative, pregnancy     Status: Abnormal   Collection Time: 03/13/19  1:43 PM  Result Value Ref Range   hCG, Beta Chain, Quant, S 60,026 (H) <5 mIU/mL  Urinalysis, Routine w reflex microscopic     Status: Abnormal   Collection Time: 03/13/19  2:00 PM  Result Value Ref Range   Color, Urine YELLOW YELLOW   APPearance HAZY (A) CLEAR   Specific Gravity, Urine 1.016 1.005 -  1.030   pH 7.0 5.0 - 8.0   Glucose,  UA NEGATIVE NEGATIVE mg/dL   Hgb urine dipstick MODERATE (A) NEGATIVE   Bilirubin Urine NEGATIVE NEGATIVE   Ketones, ur NEGATIVE NEGATIVE mg/dL   Protein, ur NEGATIVE NEGATIVE mg/dL   Nitrite NEGATIVE NEGATIVE   Leukocytes,Ua NEGATIVE NEGATIVE   RBC / HPF 0-5 0 - 5 RBC/hpf   WBC, UA 0-5 0 - 5 WBC/hpf   Bacteria, UA RARE (A) NONE SEEN   Squamous Epithelial / LPF 6-10 0 - 5   Mucus PRESENT    US Ob Less Than 14 Weeks With Ob Transvaginal  Result Date: 03/13/2019 CLINICAL DATA:  Vaginal bleeding EXAM: OBSTETRIC <14 WK [name] AND TRANSVAGINAL OB US TECHNIQUE: Both transabdominal and transvaginal ultrasound examinations were performed for complete evaluation of the gestation as well as the maternal uterus, adnexal regions, and pelvic cul-de-sac. Transvaginal technique was performed to assess early pregnancy. COMPARISON:  None. FINDINGS: Intrauterine gestational sac: Single Yolk sac:  Visualized. Embryo:  Visualized. Cardiac Activity: Visualized. Heart Rate: 116 bpm CRL:  5.54 mm   6 w   2 d                  [name] EDC: 11/04/2019 Subchorionic hemorrhage:  None visualized. Maternal uterus/adnexae: A small amount of free fluid is noted. A 1.6 cm hypoechoic cyst in the left ovary is most consistent with a small corpus luteum cyst. The right ovary is unremarkable. IMPRESSION: Single live intrauterine gestation with an estimated gestational age of [redacted] weeks and 2 days. Fetal heart rate is 116 beats per minute. No evidence of subchorionic hemorrhage. Electronically Signed   By: Jolaine Click M.D.   On: 03/13/2019 17:10    MAU Course  Procedures  MDM -ectopic workup for bleeding and pelvic pain in first trimester -+UPT -hCG: 60,026 -ABO: O positive -CMP (d/t RUQ tenderness): no treatment required for out of range labs -CBC: WNL for pregnancy -UA: WNL for active bleeding except rare bacteria, sending for urine culture -[name]: single IUP, FHR 116, no subchorionic hemorrhage,  small cyst on left ovary c/w corpus luteum, small amt free fluid -discharge to home with boyfriend  Orders Placed This Encounter  Procedures  . US OB LESS THAN 14 WEEKS WITH OB TRANSVAGINAL    Standing Status:   Standing    Number of Occurrences:   1    Order Specific Question:   Symptom/Reason for Exam    Answer:   Vaginal bleeding in pregnancy [[id number]]  . Urinalysis, Routine w reflex microscopic    Standing Status:   Standing    Number of Occurrences:   1  . CBC    Standing Status:   Standing    Number of Occurrences:   1  . Comprehensive metabolic panel    Standing Status:   Standing    Number of Occurrences:   1  . hCG, quantitative, pregnancy    Standing Status:   Standing    Number of Occurrences:   1  . ABO/Rh    Standing Status:   Standing    Number of Occurrences:   1   No orders of the defined types were placed in this encounter.   Assessment and Plan   1. Vaginal bleeding in pregnancy, first trimester   2. Vaginal bleeding in pregnancy   3. [redacted] weeks gestation of pregnancy   4. Blood type, Rh positive    Allergies as of 03/13/2019   No Known Allergies     Medication List    STOP taking these medications  ibuprofen 600 MG tablet Commonly known as:  ADVIL,MOTRIN   metroNIDAZOLE 500 MG tablet Commonly known as:  FLAGYL       -will call with positive culture results if tx needed -bleeding/SAB/MAU return precautions given -list of OB providers given, pt encouraged to schedule appt for next 1-2weeks -pt encouraged to begin PNVs -discharge to home with boyfriend in stable condition  Joni Reining E Glennda Weatherholtz 03/13/2019, 5:18 PM

## 2019-03-13 NOTE — Discharge Instructions (Signed)
Vaginal Bleeding During Pregnancy, First Trimester  A small amount of bleeding from the vagina (spotting) is relatively common during early pregnancy. It usually stops on its own. Various things may cause bleeding or spotting during early pregnancy. Some bleeding may be related to the pregnancy, and some may not. In many cases, the bleeding is normal and is not a problem. However, bleeding can also be a sign of something serious. Be sure to tell your health care provider about any vaginal bleeding right away. Some possible causes of vaginal bleeding during the first trimester include:  Infection or inflammation of the cervix.  Growths (polyps) on the cervix.  Miscarriage or threatened miscarriage.  Pregnancy tissue developing outside of the uterus (ectopic pregnancy).  A mass of tissue developing in the uterus due to an egg being fertilized incorrectly (molar pregnancy). Follow these instructions at home: Activity  Follow instructions from your health care provider about limiting your activity. Ask what activities are safe for you.  If needed, make plans for someone to help with your regular activities.  Do not have sex or orgasms until your health care provider says that this is safe. General instructions  Take over-the-counter and prescription medicines only as told by your health care provider.  Pay attention to any changes in your symptoms.  Do not use tampons or douche.  Write down how many pads you use each day, how often you change pads, and how soaked (saturated) they are.  If you pass any tissue from your vagina, save the tissue so you can show it to your health care provider.  Keep all follow-up visits as told by your health care provider. This is important. Contact a health care provider if:  You have vaginal bleeding during any part of your pregnancy.  You have cramps or labor pains.  You have a fever. Get help right away if:  You have severe cramps in your  back or abdomen.  You pass large clots or a large amount of tissue from your vagina.  Your bleeding increases.  You feel light-headed or weak, or you faint.  You have chills.  You are leaking fluid or have a gush of fluid from your vagina. Summary  A small amount of bleeding (spotting) from the vagina is relatively common during early pregnancy.  Various things may cause bleeding or spotting in early pregnancy.  Be sure to tell your health care provider about any vaginal bleeding right away. This information is not intended to replace advice given to you by your health care provider. Make sure you discuss any questions you have with your health care provider. Document Released: 09/23/2005 Document Revised: 03/18/2017 Document Reviewed: 03/18/2017 Elsevier Interactive Patient Education  2019 Elsevier [employer]. [place] [facility] Providers     [facility]     Phone: [phone]  [facility] for [facility]  Phone: [phone]  [facility] for [facility] [place]  Phone: [phone]  [facility] for [employer] [facility] [place]                           Phone: [phone]  Center for [facility] at [facility]          Phone: [phone]  [facility]    Phone: [phone]   [facility] [place])    Phone: [phone]  [facility]    Phone: [phone]  [facility]  Phone: [phone] ° °[facility]    Phone: [phone] ° °[facility]  Phone: [phone] ° °[facility]               Phone: [phone] ° °[facility]   Phone: [phone] ° °[facility]    Phone: [phone] ° ° ° ° ° ° ° ° °  ° ° ° °  ° ° °

## 2019-03-14 LAB — GC/CHLAMYDIA PROBE AMP (~~LOC~~) NOT AT ARMC
Chlamydia: NEGATIVE
Neisseria Gonorrhea: NEGATIVE

## 2019-05-19 ENCOUNTER — Other Ambulatory Visit: Payer: Self-pay

## 2019-05-19 ENCOUNTER — Telehealth (INDEPENDENT_AMBULATORY_CARE_PROVIDER_SITE_OTHER): Payer: Medicaid Other | Admitting: *Deleted

## 2019-05-19 DIAGNOSIS — Z3490 Encounter for supervision of normal pregnancy, unspecified, unspecified trimester: Secondary | ICD-10-CM | POA: Insufficient documentation

## 2019-05-19 MED ORDER — AMBULATORY NON FORMULARY MEDICATION: 1.0000 | 0 refills | Status: AC

## 2019-05-19 NOTE — Progress Notes (Signed)
I connected with  Cynthia Hull on 05/19/19 at  9:15 AM EDT by telephone and verified that I am speaking with the correct person using two identifiers.   I discussed the limitations, risks, security and privacy concerns of performing an evaluation and management service by telephone and the availability of in person appointments. I also discussed with the patient that there may be a patient responsible charge related to this service. The patient expressed understanding and agreed to proceed.  Osvaldo Human, RN 05/19/2019  9:25 AM   Reviewed pt history and pt states that in 2017 she had a traumatic vaginal deliver where baby got stuck in the birth canal and the provider had to use vacuum and forceps and the baby died shortly after from a brain hemorrhage.  Pt states the pregnancy and baby were health prior to delivery.   Pt has since had a healthy boy in 2018.  Pt requesting a repeat C-section because she states she never wants to deliver vaginally again d/t this history.    Advised pt that her next ultrasound would be for anatomy around 19-20 weeks. Pt advised of testing that would be done at her first OB visit.  Pt verbalized understanding.   Order placed for babyscripts.  Pt education on how to register, download and use babyscripts.  Pt does not have access to a BP cuff.  Order placed for BP cuff and message sent to front office staff to fax prescription to pharmacy.  Pt advised that it takes about a week for it to be delivered and requested pt contact the clinic if she does not receive her cuff by then.  Instructed pt to bring her cuff to her first OB appointment so she could be instructed in it's use.    Anatomy Ultrasound scheduled for June 15 @ 1245.

## 2019-05-19 NOTE — Progress Notes (Signed)
I have reviewed the chart and agree with nursing staff's documentation of this patient's encounter.  Elverda Wendel, MD 05/19/2019 1:26 PM    

## 2019-06-06 ENCOUNTER — Telehealth: Payer: Self-pay | Admitting: Family Medicine

## 2019-06-06 NOTE — Telephone Encounter (Signed)
Tried to call patient and was unable to get through. Left patient a Mychart message.

## 2019-06-07 ENCOUNTER — Encounter: Payer: Self-pay | Admitting: Obstetrics and Gynecology

## 2019-06-07 ENCOUNTER — Encounter: Payer: Self-pay | Admitting: *Deleted

## 2019-06-07 ENCOUNTER — Ambulatory Visit (INDEPENDENT_AMBULATORY_CARE_PROVIDER_SITE_OTHER): Payer: Medicaid Other | Admitting: Obstetrics and Gynecology

## 2019-06-07 ENCOUNTER — Other Ambulatory Visit: Payer: Self-pay

## 2019-06-07 VITALS — BP 115/60 | HR 71 | Temp 98.1°F | Wt 144.4 lb

## 2019-06-07 DIAGNOSIS — O09299 Supervision of pregnancy with other poor reproductive or obstetric history, unspecified trimester: Secondary | ICD-10-CM

## 2019-06-07 DIAGNOSIS — Z3492 Encounter for supervision of normal pregnancy, unspecified, second trimester: Secondary | ICD-10-CM

## 2019-06-07 DIAGNOSIS — O99322 Drug use complicating pregnancy, second trimester: Secondary | ICD-10-CM

## 2019-06-07 DIAGNOSIS — Z3A18 18 weeks gestation of pregnancy: Secondary | ICD-10-CM | POA: Diagnosis not present

## 2019-06-07 DIAGNOSIS — O09292 Supervision of pregnancy with other poor reproductive or obstetric history, second trimester: Secondary | ICD-10-CM | POA: Diagnosis not present

## 2019-06-07 DIAGNOSIS — Z98891 History of uterine scar from previous surgery: Secondary | ICD-10-CM

## 2019-06-07 DIAGNOSIS — F1210 Cannabis abuse, uncomplicated: Secondary | ICD-10-CM | POA: Insufficient documentation

## 2019-06-07 NOTE — Progress Notes (Signed)
INITIAL PRENATAL VISIT NOTE  Subjective:  Cynthia Hull is a 28 y.o. G3P2001 at 91w3dby LMP c/w 6 week [place] seen today for her initial prenatal visit. This is an unplanned pregnancy. She and partner are happy with the pregnancy. She was using nothing for birth control previously. She has an obstetric history significant for VAVD with death of infant shortly after delivery, primary elective CS. She has a medical history significant for nothing.  Patient reports nausea is improving.  Contractions: Not present. Vag. Bleeding: None.  Movement: Present. Denies leaking of fluid.    Past Medical History:  Diagnosis Date  . Headache     Past Surgical History:  Procedure Laterality Date  . CESAREAN SECTION N/A 10/22/2017   Procedure: CESAREAN SECTION;  Surgeon: PAletha Halim MD;  [place] [facility]  Service: Obstetrics;  Laterality: N/A;  . NO PAST SURGERIES     OB History  Gravida Para Term Preterm AB Living  3 2 2  0 0 1  SAB TAB Ectopic Multiple Live Births  0 0 0 0 2    # Outcome Date GA Lbr Len/2nd Weight Sex Delivery Anes PTL Lv  3 Current           2 Term 10/22/17 335w4d6 lb 8.9 oz (2.975 kg) M CS-LTranv Spinal  LIV     Birth Comments: bilateral postaxial polydactyly  1 Term 2017    M Vag-Spont   ND    Social History   Socioeconomic History  . Marital status: Single    Spouse name: Not on file  . Number of children: Not on file  . Years of education: Not on file  . Highest education level: Not on file  Occupational History  . Not on file  Social Needs  . Financial resource strain: Not on file  . Food insecurity:    Worry: Never true    Inability: Never true  . Transportation needs:    Medical: No    Non-medical: No  Tobacco Use  . Smoking status: Former Smoker    Years: 1.00    Types: Cigars  . Smokeless tobacco: Never Used  Substance and Sexual Activity  . Alcohol use: Not Currently  . Drug use: Yes    Types: Marijuana    Comment: March  2020  . Sexual activity: Yes    Birth control/protection: None  Lifestyle  . Physical activity:    Days per week: Not on file    Minutes per session: Not on file  . Stress: Not on file  Relationships  . Social connections:    Talks on phone: Not on file    Gets together: Not on file    Attends religious service: Not on file    Active member of club or organization: Not on file    Attends meetings of clubs or organizations: Not on file    Relationship status: Not on file  Other Topics Concern  . Not on file  Social History Narrative  . Not on file    Family History  Problem Relation Age of Onset  . Hypertension Mother     Current Outpatient Medications:  .  AMBULATORY NON FORMULARY MEDICATION, 1 Device by Other route once a week. Blood Pressure Cuff Medium Monitored Regularly at home ICD 10:Z34.90, Disp: 1 kit, Rfl: 0 .  Prenatal Vit-Fe Fumarate-FA (PRENATAL PO), Take by mouth., Disp: , Rfl:   No Known Allergies  Review of Systems: Negative except for what  is mentioned in HPI.  Objective:   Vitals:   06/07/19 0852  BP: 115/60  Pulse: 71  Temp: 98.1 F (36.7 C)  Weight: 144 lb 6.4 oz (65.5 kg)    Fetal Status: Fetal Heart Rate (bpm): 152   Movement: Present     Physical Exam: BP 115/60   Pulse 71   Temp 98.1 F (36.7 C)   Wt 144 lb 6.4 oz (65.5 kg)   LMP 01/29/2019   BMI 25.58 kg/m  CONSTITUTIONAL: Well-developed, well-nourished female in no acute distress.  NEUROLOGIC: Alert and oriented to person, place, and time. Normal reflexes, muscle tone coordination. No cranial nerve deficit noted. PSYCHIATRIC: Normal mood and affect. Normal behavior. Normal judgment and thought content. SKIN: Skin is warm and dry. No rash noted. Not diaphoretic. No erythema. No pallor. HENT:  Normocephalic, atraumatic, External right and left ear normal. Oropharynx is clear and moist EYES: Conjunctivae and EOM are normal. Pupils are equal, round, and reactive to light. No scleral  icterus.  NECK: Normal range of motion, supple, no masses CARDIOVASCULAR: Normal heart rate noted RESPIRATORY: Effort normal, no problems with respiration noted BREASTS: deferred ABDOMEN: Soft, nontender, nondistended, gravid. GU: deferred MUSCULOSKELETAL: Normal range of motion. EXT:  No edema and no tenderness.  Assessment and Plan:  Pregnancy: G3P2001 at 35w3dby LMP c/w 6 week [place] 1. Encounter for supervision of low-risk pregnancy in second trimester - [place] multiple providers, fellows, medical students, virtual visits, MyChart.  - Genetic Screening - Obstetric Panel, Including HIV - Culture, OB Urine - AFP, Serum, Open Spina Bifida - Drug Screen, Urine  2. H/O: C-section - Desires RCS, will need TOLAC counseling, but is sure of CS due to h/o neonatal death  3. Pregnancy with history of neonatal death Secondary to VAVD @ [facility]  4. Marijuana abuse Trying to quit, reviewed importance of quitting - Drug Screen, Urine Smoking and tobacco cessation was discussed at today's visit for 4 minutes    Preterm labor symptoms and general obstetric precautions including but not limited to vaginal bleeding, contractions, leaking of fluid and fetal movement were reviewed in detail with the patient.   Please refer to After Visit Summary for other counseling recommendations.   Return in about 4 weeks (around 07/05/2019) for virtual, OB visit (MD).  KSloan Leiter6/09/2019 9:24 AM

## 2019-06-12 ENCOUNTER — Ambulatory Visit (HOSPITAL_COMMUNITY)
Admission: RE | Admit: 2019-06-12 | Discharge: 2019-06-12 | Disposition: A | Discharge status: Home / Self Care | Payer: Medicaid Other | Source: Ambulatory Visit | Attending: Obstetrics and Gynecology | Admitting: Obstetrics and Gynecology

## 2019-06-12 ENCOUNTER — Other Ambulatory Visit: Payer: Self-pay | Admitting: Obstetrics & Gynecology

## 2019-06-12 ENCOUNTER — Other Ambulatory Visit: Payer: Self-pay

## 2019-06-12 DIAGNOSIS — O09292 Supervision of pregnancy with other poor reproductive or obstetric history, second trimester: Secondary | ICD-10-CM | POA: Diagnosis not present

## 2019-06-12 DIAGNOSIS — O34219 Maternal care for unspecified type scar from previous cesarean delivery: Secondary | ICD-10-CM | POA: Diagnosis not present

## 2019-06-12 DIAGNOSIS — Z3A19 19 weeks gestation of pregnancy: Secondary | ICD-10-CM

## 2019-06-12 DIAGNOSIS — Z3490 Encounter for supervision of normal pregnancy, unspecified, unspecified trimester: Secondary | ICD-10-CM | POA: Diagnosis present

## 2019-06-15 LAB — OBSTETRIC PANEL, INCLUDING HIV
Antibody Screen: NEGATIVE
Basophils Absolute: 0 10*3/uL (ref 0.0–0.2)
Basos: 0 %
EOS (ABSOLUTE): 0.2 10*3/uL (ref 0.0–0.4)
Eos: 2 %
HIV Screen 4th Generation wRfx: NONREACTIVE
Hematocrit: 30.2 % — ABNORMAL LOW (ref 34.0–46.6)
Hemoglobin: 10.2 g/dL — ABNORMAL LOW (ref 11.1–15.9)
Hepatitis B Surface Ag: NEGATIVE
Immature Grans (Abs): 0 10*3/uL (ref 0.0–0.1)
Immature Granulocytes: 0 %
Lymphocytes Absolute: 2 10*3/uL (ref 0.7–3.1)
Lymphs: 26 %
MCH: 29.4 pg (ref 26.6–33.0)
MCHC: 33.8 g/dL (ref 31.5–35.7)
MCV: 87 fL (ref 79–97)
Monocytes Absolute: 0.6 10*3/uL (ref 0.1–0.9)
Monocytes: 7 %
Neutrophils Absolute: 4.9 10*3/uL (ref 1.4–7.0)
Neutrophils: 65 %
Platelets: 280 10*3/uL (ref 150–450)
RBC: 3.47 x10E6/uL — ABNORMAL LOW (ref 3.77–5.28)
RDW: 14.3 % (ref 11.7–15.4)
RPR Ser Ql: NONREACTIVE
Rh Factor: POSITIVE
Rubella Antibodies, IGG: 1.24 index (ref >0.99)
WBC: 7.6 10*3/uL (ref 3.4–10.8)

## 2019-06-15 LAB — AFP, SERUM, OPEN SPINA BIFIDA
AFP MoM: 0.84
AFP Value: 42.3 ng/mL
Gest. Age on Collection Date: 18.4 weeks
Maternal Age At EDD: 26.8 yr
OSBR Risk 1 IN: 10000
Test Results:: NEGATIVE
Weight: 144 [lb_av]

## 2019-06-19 MED ORDER — FERROUS SULFATE 325 (65 FE) MG PO TABS: 325.0000 mg | ORAL_TABLET | Freq: Two times a day (BID) | ORAL | 1 refills | Status: DC

## 2019-06-19 NOTE — Addendum Note (Signed)
Addended by: Vivien Rota on: 06/19/2019 05:13 PM   Modules accepted: Orders

## 2019-06-20 ENCOUNTER — Encounter: Payer: Self-pay | Admitting: *Deleted

## 2019-06-21 ENCOUNTER — Encounter: Payer: Self-pay | Admitting: *Deleted

## 2019-06-27 ENCOUNTER — Telehealth: Payer: Self-pay | Admitting: Advanced Practice Midwife

## 2019-06-27 NOTE — Telephone Encounter (Signed)
Left a voicemail message instructing the patient of how enter the virtual visit via mychart. Informed the patient is she has any questions please call our office at [phone]. °

## 2019-06-28 ENCOUNTER — Encounter: Payer: Self-pay | Admitting: Obstetrics and Gynecology

## 2019-06-28 ENCOUNTER — Other Ambulatory Visit: Payer: Self-pay

## 2019-06-28 ENCOUNTER — Telehealth: Payer: Self-pay | Admitting: Obstetrics and Gynecology

## 2019-06-28 ENCOUNTER — Telehealth (INDEPENDENT_AMBULATORY_CARE_PROVIDER_SITE_OTHER): Payer: Medicaid Other | Admitting: Obstetrics and Gynecology

## 2019-06-28 VITALS — BP 113/60 | HR 103

## 2019-06-28 DIAGNOSIS — Z3A21 21 weeks gestation of pregnancy: Secondary | ICD-10-CM

## 2019-06-28 DIAGNOSIS — Z3492 Encounter for supervision of normal pregnancy, unspecified, second trimester: Secondary | ICD-10-CM

## 2019-06-28 DIAGNOSIS — Z98891 History of uterine scar from previous surgery: Secondary | ICD-10-CM

## 2019-06-28 DIAGNOSIS — O09292 Supervision of pregnancy with other poor reproductive or obstetric history, second trimester: Secondary | ICD-10-CM

## 2019-06-28 DIAGNOSIS — O09299 Supervision of pregnancy with other poor reproductive or obstetric history, unspecified trimester: Secondary | ICD-10-CM

## 2019-06-28 NOTE — Progress Notes (Signed)
   TELEHEALTH OBSTETRICS PRENATAL VIRTUAL VIDEO VISIT ENCOUNTER NOTE  Provider location: Center for [employer] at [facility]   I connected with Cynthia Hull on 06/28/19 at 10:35 AM EDT by MyChart Video Encounter at home and verified that I am speaking with the correct person using two identifiers.   I discussed the limitations, risks, security and privacy concerns of performing an evaluation and management service by telephone and the availability of in person appointments. I also discussed with the patient that there may be a patient responsible charge related to this service. The patient expressed understanding and agreed to proceed. Subjective:  Cynthia Hull is a 28 y.o. G3P2001 at [redacted]w[redacted]d being seen today for ongoing prenatal care.  She is currently monitored for the following issues for this high-risk pregnancy and has Pregnancy with history of neonatal death; Engorgement of breast associated with childbirth, postpartum; Supervision of low-risk pregnancy; H/O: C-section; and Marijuana abuse on their problem list.  Patient reports no complaints.  Contractions: Not present. Vag. Bleeding: None.  Movement: Present. Denies any leaking of fluid.   The following portions of the patient's history were reviewed and updated as appropriate: allergies, current medications, past family history, past medical history, past social history, past surgical history and problem list.   Objective:   Vitals:   06/28/19 1040  BP: 113/60  Pulse: (!) 103    Fetal Status:     Movement: Present     General:  Alert, oriented and cooperative. Patient is in no acute distress.  Respiratory: Normal respiratory effort, no problems with respiration noted  Mental Status: Normal mood and affect. Normal behavior. Normal judgment and thought content.  Rest of physical exam deferred due to type of encounter   Assessment and Plan:  Pregnancy: G3P2001 at [redacted]w[redacted]d  1. Encounter for supervision of low-risk pregnancy  in second trimester  2. H/O: C-section Desires RCS  3. Pregnancy with history of neonatal death With VAVD Testing starting 36 weeks Ordered f/u growth [name]   Preterm labor symptoms and general obstetric precautions including but not limited to vaginal bleeding, contractions, leaking of fluid and fetal movement were reviewed in detail with the patient. I discussed the assessment and treatment plan with the patient. The patient was provided an opportunity to ask questions and all were answered. The patient agreed with the plan and demonstrated an understanding of the instructions. The patient was advised to call back or seek an in-person office evaluation/go to MAU at [facility] for any urgent or concerning symptoms. Please refer to After Visit Summary for other counseling recommendations.   I provided 15 minutes of face-to-face time during this encounter.  Return in about 4 weeks (around 07/26/2019) for OB visit (MD), virtual.  No future appointments.  Sloan Leiter, MD Center for [place], [place]

## 2019-06-28 NOTE — Progress Notes (Signed)
[phone] Update number on chart. 432 number is broken.  I connected with  Corinne Ports on 06/28/19 at 10:35 AM EDT by telephone/Mychart  and verified that I am speaking with the correct person using two identifiers.   I discussed the limitations, risks, security and privacy concerns of performing an evaluation and management service by telephone and the availability of in person appointments. I also discussed with the patient that there may be a patient responsible charge related to this service. The patient expressed understanding and agreed to proceed.  [place], [place] 06/28/2019  10:37 AM

## 2019-06-28 NOTE — Telephone Encounter (Signed)
Attempted to call patient with her next ob appointment ( 7/29 @ 10:35 - MyChart) . No answer, left voicemail with appointment information. Reminder letter also mailed.

## 2019-07-04 ENCOUNTER — Encounter: Payer: Self-pay | Admitting: Obstetrics and Gynecology

## 2019-07-11 ENCOUNTER — Telehealth: Payer: Self-pay | Admitting: General Practice

## 2019-07-11 NOTE — Telephone Encounter (Signed)
Patient called into front office reporting lower back pain, lower belly pain that is constant rated at a 7. Patient also reports pressure in lower abdomen and vagina. States she has been sitting at work all day and the pain over the past couple of days has gotten worse. Advised patient to go home and lay down, take extra strength tylenol and try a heating pad on low on her back to see if she feels betters. If pain/pressure continues after that without relief she should go to MAU. Patient verbalized understanding & had no questions.

## 2019-07-12 ENCOUNTER — Ambulatory Visit (HOSPITAL_COMMUNITY)
Admission: RE | Admit: 2019-07-12 | Discharge: 2019-07-12 | Disposition: A | Discharge status: Home / Self Care | Payer: Medicaid Other | Source: Ambulatory Visit | Attending: Obstetrics and Gynecology | Admitting: Obstetrics and Gynecology

## 2019-07-12 ENCOUNTER — Other Ambulatory Visit: Payer: Self-pay

## 2019-07-12 DIAGNOSIS — O09292 Supervision of pregnancy with other poor reproductive or obstetric history, second trimester: Secondary | ICD-10-CM

## 2019-07-12 DIAGNOSIS — Z362 Encounter for other antenatal screening follow-up: Secondary | ICD-10-CM | POA: Diagnosis not present

## 2019-07-12 DIAGNOSIS — Z3A23 23 weeks gestation of pregnancy: Secondary | ICD-10-CM | POA: Diagnosis not present

## 2019-07-12 DIAGNOSIS — O34219 Maternal care for unspecified type scar from previous cesarean delivery: Secondary | ICD-10-CM | POA: Diagnosis not present

## 2019-07-12 DIAGNOSIS — O09299 Supervision of pregnancy with other poor reproductive or obstetric history, unspecified trimester: Secondary | ICD-10-CM | POA: Diagnosis present

## 2019-07-26 ENCOUNTER — Other Ambulatory Visit: Payer: Self-pay

## 2019-07-26 ENCOUNTER — Telehealth (INDEPENDENT_AMBULATORY_CARE_PROVIDER_SITE_OTHER): Payer: Medicaid Other | Admitting: Obstetrics & Gynecology

## 2019-07-26 ENCOUNTER — Encounter: Payer: Self-pay | Admitting: Obstetrics & Gynecology

## 2019-07-26 VITALS — BP 105/72 | HR 86

## 2019-07-26 DIAGNOSIS — Z3492 Encounter for supervision of normal pregnancy, unspecified, second trimester: Secondary | ICD-10-CM

## 2019-07-26 DIAGNOSIS — O09299 Supervision of pregnancy with other poor reproductive or obstetric history, unspecified trimester: Secondary | ICD-10-CM

## 2019-07-26 DIAGNOSIS — Z3490 Encounter for supervision of normal pregnancy, unspecified, unspecified trimester: Secondary | ICD-10-CM

## 2019-07-26 DIAGNOSIS — F1210 Cannabis abuse, uncomplicated: Secondary | ICD-10-CM

## 2019-07-26 DIAGNOSIS — Z3A25 25 weeks gestation of pregnancy: Secondary | ICD-10-CM

## 2019-07-26 DIAGNOSIS — O09292 Supervision of pregnancy with other poor reproductive or obstetric history, second trimester: Secondary | ICD-10-CM

## 2019-07-26 DIAGNOSIS — Z98891 History of uterine scar from previous surgery: Secondary | ICD-10-CM

## 2019-07-26 NOTE — Progress Notes (Signed)
Has not been active in [employer].I asked her to take bp while online and enter into babyscripts and  To continue taking bp weekly and entering

## 2019-07-26 NOTE — Progress Notes (Signed)
10:36 Cynthia Hull not available virtually, I called and left message I am calling for your visit; wil call again , please be avaiable.  Cynthia Hull I connected with  Cynthia Hull on 07/26/19 at 10:35 AM EDT by telephone and virtually  and verified that I am speaking with the correct person using two identifiers.   I discussed the limitations, risks, security and privacy concerns of performing an evaluation and management service by telephone and the availability of in person appointments. I also discussed with the patient that there may be a patient responsible charge related to this service. The patient expressed understanding and agreed to proceed.  Luismiguel Lamere,RN 07/26/2019  10:44 AM

## 2019-07-26 NOTE — Progress Notes (Signed)
TELEHEALTH OBSTETRICS PRENATAL VIRTUAL VIDEO VISIT ENCOUNTER NOTE  [facility]   I connected with Cynthia BaldingMaiye Pascucci on 07/26/19 at 10:35 AM EDT by MyChart Video Encounter at home and verified that I am speaking with the correct person using two identifiers.   I discussed the limitations, risks, security and privacy concerns of performing an evaluation and management service virtually and the availability of in person appointments. I also discussed with the patient that there may be a patient responsible charge related to this service. The patient expressed understanding and agreed to proceed. Subjective:  Alfonse AlpersMaiye Nelda SevereWaller is a 28 y.o. G3P2001 at 7836w3d being seen today for ongoing prenatal care.  She is currently monitored for the following issues for this low-risk pregnancy and has Pregnancy with history of neonatal death; Engorgement of breast associated with childbirth, postpartum; Supervision of low-risk pregnancy; H/O: C-section; and Marijuana abuse on their problem list.  Patient reports no complaints.  Contractions: Not present. Vag. Bleeding: None.  Movement: Present. Denies any leaking of fluid.   The following portions of the patient's history were reviewed and updated as appropriate: allergies, current medications, past family history, past medical history, past social history, past surgical history and problem list.   Objective:   Vitals:   07/26/19 1049  BP: 105/72  Pulse: 86    Fetal Status:     Movement: Present     General:  Alert, oriented and cooperative. Patient is in no acute distress.  Respiratory: Normal respiratory effort, no problems with respiration noted  Mental Status: Normal mood and affect. Normal behavior. Normal judgment and thought content.  Rest of physical exam deferred due to type of encounter  Imaging: [place] Mfm Ob Follow Up  Result Date: 07/12/2019  ----------------------------------------------------------------------  OBSTETRICS REPORT                       (Signed Final 07/12/2019 03:37 pm) ---------------------------------------------------------------------- Patient Info  ID #:       [id number]                          D.O.B.:  29-Sep-1991 (27 yrs)  Name:       Cynthia Hull                    Visit Date: 07/12/2019 02:38 pm ---------------------------------------------------------------------- Performed By  Performed By:     Earley BrookeNicole S Dalrymple     [facility]                    BS, RDMS  Attending:        Noralee Spaceavi Shankar MD        Address:           [address]                                                              [address]  Referred By:      Jethro BastosUGONNA A               Location:          [facility]  [place] LargeANYANWU MD                                Fetal Care  Ref. Address:     [address]                    [place], [place]                    [id number] ---------------------------------------------------------------------- Orders   #  Description                           Code        Ordered By   1  US MFM OB FOLLOW UP                   [id number]    KELLY DAVIS  ----------------------------------------------------------------------   #  Order #                     Accession #                Episode #   1  [id number]                   [id number]                 [id number]  ---------------------------------------------------------------------- Indications   Encounter for antenatal screening for   malformations   [redacted] weeks gestation of pregnancy                 Z3A.23   Previous cesarean delivery, antepartum          O34.219   Marijuana Abuse   Poor obstetric history: Previous neonatal       O09.299   death  ---------------------------------------------------------------------- Vital Signs                                                 Height:        5'3"  ---------------------------------------------------------------------- Fetal Evaluation  Num Of Fetuses:          1  Fetal Heart              150  Rate(bpm):  Cardiac Activity:        Observed  Presentation:            Cephalic  Placenta:                Anterior  P. Cord Insertion:       Previously Visualized  Amniotic Fluid  AFI FV:      Within normal limits                              Largest Pocket(cm)                              6.53 ---------------------------------------------------------------------- Biometry  BPD:      53.9  mm     G. Age:  22w 3d  9  %    CI:         67.33  %    70 - 86                                                          FL/HC:       19.7  %    18.7 - 20.9  HC:      210.3  mm     G. Age:  23w 1d         18  %    HC/AC:       1.10       1.05 - 1.21  AC:      191.2  mm     G. Age:  23w 6d         50  %    FL/BPD:      77.0  %    71 - 87  FL:       41.5  mm     G. Age:  23w 3d         35  %    FL/AC:       21.7  %    20 - 24  HUM:      39.6  mm     G. Age:  24w 1d         55  %  LV:          5  mm  Est. FW:     608   g      1 lb 5 oz     42  %                     m ---------------------------------------------------------------------- OB History  Gravidity:    3         Term:   2  Living:       2 ---------------------------------------------------------------------- Gestational Age  U/S Today:     23w 2d                                        EDD:    11/06/19  Best:          23w 4d     Det. By:  Previous Ultrasound      EDD:    11/04/19                                      (03/13/19) ---------------------------------------------------------------------- Anatomy  Cranium:               Appears normal         LVOT:                   Previously seen  Cavum:                 Previously seen        Aortic Arch:            Previously seen  Ventricles:            Appears normal  Ductal Arch:            Previously seen  Choroid Plexus:        Previously seen        Diaphragm:               Appears normal  Cerebellum:            Previously seen        Stomach:                Appears normal,                                                                        left sided  Posterior Fossa:       Previously seen        Abdomen:                Appears normal  Nuchal Fold:           Previously seen        Abdominal Wall:         Previously seen  Face:                  Orbits and profile     Cord Vessels:           Previously seen                         previously seen  Lips:                  Previously seen        Kidneys:                Appear normal  Palate:                Not well visualized    Bladder:                Appears normal  Thoracic:              Appears normal         Spine:                  Appears normal  Heart:                 Echogenic focus        Upper Extremities:      Previously seen                         in LV prev seen  RVOT:                  Previously seen        Lower Extremities:      Previously seen  Other:  Female gender previously seen. Nasal bone previously visualized. ---------------------------------------------------------------------- Cervix Uterus Adnexa  Cervix  Length:            4.6  cm.  Normal appearance by transabdominal scan. ---------------------------------------------------------------------- Impression  Amniotic fluid is normal and good fetal activity is seen. Fetal  growth is appropriate for gestational age. Fetal anatomical  survey was completed and  appears normal. Spine appears  normal. ---------------------------------------------------------------------- Recommendations  -Serial fetal growth assessment every 4 weeks. ----------------------------------------------------------------------                  Noralee Spaceavi Shankar, MD Electronically Signed Final Report   07/12/2019 03:37 pm ----------------------------------------------------------------------   Assessment and Plan:  Pregnancy: G3P2001 at 1142w3d 1. Encounter for supervision of low-risk  pregnancy, antepartum + FM doing well  2. Pregnancy with history of neonatal death NOTE this is not an IUFD. No antepartum testing indicated.   3. Marijuana abuse  4. H/O: C-section Pt for scheduled c-section at 39 weeks. Please schedule at 28 week visit.    Preterm labor symptoms and general obstetric precautions including but not limited to vaginal bleeding, contractions, leaking of fluid and fetal movement were reviewed in detail with the patient. I discussed the assessment and treatment plan with the patient. The patient was provided an opportunity to ask questions and all were answered. The patient agreed with the plan and demonstrated an understanding of the instructions. The patient was advised to call back or seek an in-person office evaluation/[facility] for any urgent or concerning symptoms. Please refer to After Visit Summary for other counseling recommendations.   I provided 12 minutes of face-to-face time during this encounter.  Return in about 4 weeks (around 08/23/2019) for in person.  Future Appointments  Date Time Provider Department Center  08/23/2019  8:50 AM [facility] LAB [facility]  08/23/2019  9:15 AM Marny LowensteinWenzel, Julie N, PA-C [facility]    Willodean Rosenthalarolyn Harraway-Smith, MD Center for [facility], [facility]

## 2019-08-01 NOTE — Telephone Encounter (Signed)
Opened in error

## 2019-08-21 ENCOUNTER — Other Ambulatory Visit: Payer: Self-pay | Admitting: General Practice

## 2019-08-21 DIAGNOSIS — Z3493 Encounter for supervision of normal pregnancy, unspecified, third trimester: Secondary | ICD-10-CM

## 2019-08-23 ENCOUNTER — Other Ambulatory Visit: Payer: Medicaid Other

## 2019-08-23 ENCOUNTER — Encounter: Payer: Medicaid Other | Admitting: Medical

## 2019-08-24 ENCOUNTER — Telehealth: Payer: Self-pay | Admitting: Family Medicine

## 2019-08-24 NOTE — Telephone Encounter (Signed)
Attempted to call patient about her appointment on 8/28 @ 9:10. No answer left voicemail instructing patient to wear a face mask for the entire appointment and no visitors are allowed during the visit. Patient instructed not to attend the appointment if she was any symptoms. Symptom list and office number left. Patient instructed to come fasting.

## 2019-08-25 ENCOUNTER — Other Ambulatory Visit: Payer: Self-pay

## 2019-08-25 ENCOUNTER — Other Ambulatory Visit: Payer: Medicaid Other

## 2019-08-25 DIAGNOSIS — Z3493 Encounter for supervision of normal pregnancy, unspecified, third trimester: Secondary | ICD-10-CM

## 2019-08-26 LAB — CBC
Hematocrit: 27.1 % — ABNORMAL LOW (ref 34.0–46.6)
Hemoglobin: 9.4 g/dL — ABNORMAL LOW (ref 11.1–15.9)
MCH: 30 pg (ref 26.6–33.0)
MCHC: 34.7 g/dL (ref 31.5–35.7)
MCV: 87 fL (ref 79–97)
Platelets: 278 10*3/uL (ref 150–450)
RBC: 3.13 x10E6/uL — ABNORMAL LOW (ref 3.77–5.28)
RDW: 12.7 % (ref 11.7–15.4)
WBC: 9.1 10*3/uL (ref 3.4–10.8)

## 2019-08-26 LAB — GLUCOSE TOLERANCE, 2 HOURS W/ 1HR
Glucose, 1 hour: 98 mg/dL (ref 65–179)
Glucose, 2 hour: 81 mg/dL (ref 65–152)
Glucose, Fasting: 77 mg/dL (ref 65–91)

## 2019-08-26 LAB — HIV ANTIBODY (ROUTINE TESTING W REFLEX): HIV Screen 4th Generation wRfx: NONREACTIVE

## 2019-08-26 LAB — RPR: RPR Ser Ql: NONREACTIVE

## 2019-08-28 ENCOUNTER — Telehealth: Payer: Self-pay | Admitting: Family Medicine

## 2019-08-28 NOTE — Telephone Encounter (Signed)
Called the patient to confirm the upcoming appointment. Left a detailed voicemail informing the patient if she’s been diagnosed with covid or in close contact with someone who’s had covid please call to reschedule the appointment. Also advised if she has experienced any flu like symptoms such as sore throat, shortness of breath, fever, or rash please also call to rescheduled the appointment. °

## 2019-08-29 ENCOUNTER — Encounter: Payer: Self-pay | Admitting: Medical

## 2019-08-29 ENCOUNTER — Ambulatory Visit (INDEPENDENT_AMBULATORY_CARE_PROVIDER_SITE_OTHER): Payer: Medicaid Other | Admitting: Medical

## 2019-08-29 ENCOUNTER — Other Ambulatory Visit: Payer: Self-pay

## 2019-08-29 VITALS — BP 116/60 | HR 69 | Temp 97.6°F | Wt 152.2 lb

## 2019-08-29 DIAGNOSIS — Z3493 Encounter for supervision of normal pregnancy, unspecified, third trimester: Secondary | ICD-10-CM

## 2019-08-29 DIAGNOSIS — Z23 Encounter for immunization: Secondary | ICD-10-CM

## 2019-08-29 DIAGNOSIS — O09293 Supervision of pregnancy with other poor reproductive or obstetric history, third trimester: Secondary | ICD-10-CM | POA: Diagnosis not present

## 2019-08-29 DIAGNOSIS — O09299 Supervision of pregnancy with other poor reproductive or obstetric history, unspecified trimester: Secondary | ICD-10-CM

## 2019-08-29 DIAGNOSIS — Z3A30 30 weeks gestation of pregnancy: Secondary | ICD-10-CM | POA: Diagnosis not present

## 2019-08-29 DIAGNOSIS — O34211 Maternal care for low transverse scar from previous cesarean delivery: Secondary | ICD-10-CM | POA: Diagnosis not present

## 2019-08-29 DIAGNOSIS — Z98891 History of uterine scar from previous surgery: Secondary | ICD-10-CM
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
## 2019-08-29 NOTE — Patient Instructions (Signed)
Fetal Movement Counts Patient Name: ________________________________________________ Patient Due Date: ____________________ What is a fetal movement count?  A fetal movement count is the number of times that you feel your baby move during a certain amount of time. This may also be called a fetal kick count. A fetal movement count is recommended for every pregnant woman. You may be asked to start counting fetal movements as early as week 28 of your pregnancy. Pay attention to when your baby is most active. You may notice your baby's sleep and wake cycles. You may also notice things that make your baby move more. You should do a fetal movement count:  When your baby is normally most active.  At the same time each day. A good time to count movements is while you are resting, after having something to eat and drink. How do I count fetal movements? 1. Find a quiet, comfortable area. Sit, or lie down on your side. 2. Write down the date, the start time and stop time, and the number of movements that you felt between those two times. Take this information with you to your health care visits. 3. For 2 hours, count kicks, flutters, swishes, rolls, and jabs. You should feel at least 10 movements during 2 hours. 4. You may stop counting after you have felt 10 movements. 5. If you do not feel 10 movements in 2 hours, have something to eat and drink. Then, keep resting and counting for 1 hour. If you feel at least 4 movements during that hour, you may stop counting. Contact a health care provider if:  You feel fewer than 4 movements in 2 hours.  Your baby is not moving like he or she usually does. Date: ____________ Start time: ____________ Stop time: ____________ Movements: ____________ Date: ____________ Start time: ____________ Stop time: ____________ Movements: ____________ Date: ____________ Start time: ____________ Stop time: ____________ Movements: ____________ Date: ____________ Start time:  ____________ Stop time: ____________ Movements: ____________ Date: ____________ Start time: ____________ Stop time: ____________ Movements: ____________ Date: ____________ Start time: ____________ Stop time: ____________ Movements: ____________ Date: ____________ Start time: ____________ Stop time: ____________ Movements: ____________ Date: ____________ Start time: ____________ Stop time: ____________ Movements: ____________ Date: ____________ Start time: ____________ Stop time: ____________ Movements: ____________ This information is not intended to replace advice given to you by your health care provider. Make sure you discuss any questions you have with your health care provider. Document Released: 01/13/2007 Document Revised: 01/03/2019 Document Reviewed: 01/23/2016 Elsevier Patient Education  2020 [employer] Braxton Hicks Contractions Contractions of the uterus can occur throughout pregnancy, but they are not always a sign that you are in labor. You may have practice contractions called Braxton Hicks contractions. These false labor contractions are sometimes confused with true labor. What are Braxton Hicks contractions? Braxton Hicks contractions are tightening movements that occur in the muscles of the uterus before labor. Unlike true labor contractions, these contractions do not result in opening (dilation) and thinning of the cervix. Toward the end of pregnancy (32-34 weeks), Braxton Hicks contractions can happen more often and may become stronger. These contractions are sometimes difficult to tell apart from true labor because they can be very uncomfortable. You should not feel embarrassed if you go to the hospital with false labor. Sometimes, the only way to tell if you are in true labor is for your health care provider to look for changes in the cervix. The health care provider will do a physical exam and may monitor your contractions. If you   are not in true labor, the exam should show  that your cervix is not dilating and your water has not broken. If there are no other health problems associated with your pregnancy, it is completely safe for you to be sent home with false labor. You may continue to have Braxton Hicks contractions until you go into true labor. How to tell the difference between true labor and false labor True labor  Contractions last 30-70 seconds.  Contractions become very regular.  Discomfort is usually felt in the top of the uterus, and it spreads to the lower abdomen and low back.  Contractions do not go away with walking.  Contractions usually become more intense and increase in frequency.  The cervix dilates and gets thinner. False labor  Contractions are usually shorter and not as strong as true labor contractions.  Contractions are usually irregular.  Contractions are often felt in the front of the lower abdomen and in the groin.  Contractions may go away when you walk around or change positions while lying down.  Contractions get weaker and are shorter-lasting as time goes on.  The cervix usually does not dilate or become thin. Follow these instructions at home:   Take over-the-counter and prescription medicines only as told by your health care provider.  Keep up with your usual exercises and follow other instructions from your health care provider.  Eat and drink lightly if you think you are going into labor.  If Braxton Hicks contractions are making you uncomfortable: ? Change your position from lying down or resting to walking, or change from walking to resting. ? Sit and rest in a tub of warm water. ? Drink enough fluid to keep your urine pale yellow. Dehydration may cause these contractions. ? Do slow and deep breathing several times an hour.  Keep all follow-up prenatal visits as told by your health care provider. This is important. Contact a health care provider if:  You have a fever.  You have continuous pain in  your abdomen. Get help right away if:  Your contractions become stronger, more regular, and closer together.  You have fluid leaking or gushing from your vagina.  You pass blood-tinged mucus (bloody show).  You have bleeding from your vagina.  You have low back pain that you never had before.  You feel your baby's head pushing down and causing pelvic pressure.  Your baby is not moving inside you as much as it used to. Summary  Contractions that occur before labor are called Braxton Hicks contractions, false labor, or practice contractions.  Braxton Hicks contractions are usually shorter, weaker, farther apart, and less regular than true labor contractions. True labor contractions usually become progressively stronger and regular, and they become more frequent.  Manage discomfort from Braxton Hicks contractions by changing position, resting in a warm bath, drinking plenty of water, or practicing deep breathing. This information is not intended to replace advice given to you by your health care provider. Make sure you discuss any questions you have with your health care provider. Document Released: 04/29/2017 Document Revised: 11/26/2017 Document Reviewed: 04/29/2017 Elsevier Patient Education  2020 Elsevier Inc.  

## 2019-08-29 NOTE — Addendum Note (Signed)
Addended by: Bethanne Ginger on: 08/29/2019 04:02 PM   Modules accepted: Orders

## 2019-09-08 ENCOUNTER — Other Ambulatory Visit: Payer: Self-pay

## 2019-09-08 ENCOUNTER — Ambulatory Visit (HOSPITAL_COMMUNITY)
Admission: RE | Admit: 2019-09-08 | Discharge: 2019-09-08 | Disposition: A | Discharge status: Home / Self Care | Payer: Medicaid Other | Source: Ambulatory Visit | Attending: Obstetrics and Gynecology | Admitting: Obstetrics and Gynecology

## 2019-09-08 ENCOUNTER — Other Ambulatory Visit (HOSPITAL_COMMUNITY): Payer: Self-pay | Admitting: *Deleted

## 2019-09-08 DIAGNOSIS — O34219 Maternal care for unspecified type scar from previous cesarean delivery: Secondary | ICD-10-CM

## 2019-09-08 DIAGNOSIS — Z3A31 31 weeks gestation of pregnancy: Secondary | ICD-10-CM | POA: Diagnosis not present

## 2019-09-08 DIAGNOSIS — O09293 Supervision of pregnancy with other poor reproductive or obstetric history, third trimester: Secondary | ICD-10-CM | POA: Diagnosis not present

## 2019-09-08 DIAGNOSIS — Z3493 Encounter for supervision of normal pregnancy, unspecified, third trimester: Secondary | ICD-10-CM | POA: Diagnosis not present

## 2019-09-08 DIAGNOSIS — Z362 Encounter for other antenatal screening follow-up: Secondary | ICD-10-CM

## 2019-09-11 ENCOUNTER — Encounter: Payer: Self-pay | Admitting: Nurse Practitioner

## 2019-09-11 DIAGNOSIS — O99013 Anemia complicating pregnancy, third trimester: Secondary | ICD-10-CM | POA: Insufficient documentation

## 2019-09-11 DIAGNOSIS — O9081 Anemia of the puerperium: Secondary | ICD-10-CM | POA: Insufficient documentation

## 2019-09-12 ENCOUNTER — Other Ambulatory Visit: Payer: Self-pay

## 2019-09-12 ENCOUNTER — Telehealth (INDEPENDENT_AMBULATORY_CARE_PROVIDER_SITE_OTHER): Payer: Medicaid Other | Admitting: Nurse Practitioner

## 2019-09-12 ENCOUNTER — Encounter: Payer: Self-pay | Admitting: Nurse Practitioner

## 2019-09-12 DIAGNOSIS — Z3A32 32 weeks gestation of pregnancy: Secondary | ICD-10-CM

## 2019-09-12 DIAGNOSIS — Z3493 Encounter for supervision of normal pregnancy, unspecified, third trimester: Secondary | ICD-10-CM

## 2019-09-12 NOTE — Progress Notes (Signed)
0

## 2019-09-12 NOTE — Progress Notes (Signed)
   TELEHEALTH VIRTUAL OBSTETRICS VISIT ENCOUNTER NOTE  I connected with Cynthia Hull on 09/12/19 at  4:15 PM EDT by MyChart at home and verified that I am speaking with the correct person using two identifiers.   I discussed the limitations, risks, security and privacy concerns of performing an evaluation and management service by telephone and the availability of in person appointments. I also discussed with the patient that there may be a patient responsible charge related to this service. The patient expressed understanding and agreed to proceed.  Subjective:  Cynthia Hull is a 28 y.o. G3P2001 at [redacted]w[redacted]d being followed for ongoing prenatal care.  She is currently monitored for the following issues for this low-risk pregnancy and has Pregnancy with history of neonatal death; Supervision of low-risk pregnancy; H/O: C-section; Marijuana abuse; and Anemia in pregnancy, third trimester on their problem list.  Patient reports no complaints. Reports fetal movement. Denies any contractions, bleeding or leaking of fluid.   The following portions of the patient's history were reviewed and updated as appropriate: allergies, current medications, past family history, past medical history, past social history, past surgical history and problem list.   Objective:   General:  Alert, oriented and cooperative.   Mental Status: Normal mood and affect perceived. Normal judgment and thought content.  Rest of physical exam deferred due to type of encounter  Assessment and Plan:  Pregnancy: G3P2001 at [redacted]w[redacted]d  1.  Supervision of pregnancy Advised she is anemic - HGB 9.4.  She has iron supplements at home and discussed taking them again.  Advised stool softener to avoid constipation. Is scheduled for repeat C/S Virtual visit in 2 weeks and in person visit in 4 weeks.  Preterm labor symptoms and general obstetric precautions including but not limited to vaginal bleeding, contractions, leaking of fluid and fetal  movement were reviewed in detail with the patient.  I discussed the assessment and treatment plan with the patient. The patient was provided an opportunity to ask questions and all were answered. The patient agreed with the plan and demonstrated an understanding of the instructions. The patient was advised to call back or seek an in-person office evaluation/go to MAU at [facility] for any urgent or concerning symptoms. Please refer to After Visit Summary for other counseling recommendations.   I provided 8 minutes of non-face-to-face time during this encounter.  Return in about 2 weeks (around 09/26/2019) for ROB virtual.  Future Appointments  Date Time Provider [place]  10/06/2019 10:00 AM WH-MFC [name] 3 WH-MFCUS MFC-US    Maurine Mowbray L Sabriyah Wilcher, [place] for [employer], [place]

## 2019-09-12 NOTE — Progress Notes (Signed)
4:18p- Called tp for My Chart visit, no answer, left VM,will retry in 10 min.   4:29P-Pt answered.

## 2019-09-14 ENCOUNTER — Telehealth: Payer: Self-pay | Admitting: Family Medicine

## 2019-09-14 NOTE — Telephone Encounter (Signed)
I called Cynthia Hull and left a voice message that I was returning her call and that she may send [name] a Mychart message with as much detail as you would like or call us and leave a message on the nurse voicemail .  Jacques Navy

## 2019-09-14 NOTE — Telephone Encounter (Signed)
Patient has questions for the nurse. Requesting a call back.

## 2019-09-20 ENCOUNTER — Emergency Department (HOSPITAL_COMMUNITY): Payer: Medicaid Other

## 2019-09-20 ENCOUNTER — Encounter (HOSPITAL_COMMUNITY): Payer: Self-pay

## 2019-09-20 ENCOUNTER — Other Ambulatory Visit: Payer: Self-pay

## 2019-09-20 ENCOUNTER — Inpatient Hospital Stay (HOSPITAL_BASED_OUTPATIENT_CLINIC_OR_DEPARTMENT_OTHER): Payer: Medicaid Other

## 2019-09-20 ENCOUNTER — Inpatient Hospital Stay (HOSPITAL_COMMUNITY)
Admission: EM | Admit: 2019-09-20 | Discharge: 2019-09-20 | Discharge status: Against Medical Advice | Payer: Medicaid Other | Attending: Obstetrics & Gynecology | Admitting: Obstetrics & Gynecology

## 2019-09-20 DIAGNOSIS — M545 Low back pain: Secondary | ICD-10-CM | POA: Insufficient documentation

## 2019-09-20 DIAGNOSIS — O9A213 Injury, poisoning and certain other consequences of external causes complicating pregnancy, third trimester: Secondary | ICD-10-CM

## 2019-09-20 DIAGNOSIS — Z79899 Other long term (current) drug therapy: Secondary | ICD-10-CM | POA: Insufficient documentation

## 2019-09-20 DIAGNOSIS — Y92410 Unspecified street and highway as the place of occurrence of the external cause: Secondary | ICD-10-CM | POA: Insufficient documentation

## 2019-09-20 DIAGNOSIS — Z87891 Personal history of nicotine dependence: Secondary | ICD-10-CM | POA: Diagnosis not present

## 2019-09-20 DIAGNOSIS — R109 Unspecified abdominal pain: Secondary | ICD-10-CM | POA: Diagnosis not present

## 2019-09-20 DIAGNOSIS — Z3A33 33 weeks gestation of pregnancy: Secondary | ICD-10-CM

## 2019-09-20 DIAGNOSIS — M542 Cervicalgia: Secondary | ICD-10-CM | POA: Diagnosis not present

## 2019-09-20 HISTORY — DX: Anemia, unspecified: D64.9

## 2019-09-20 HISTORY — DX: Unspecified infectious disease: B99.9

## 2019-09-20 MED ORDER — CYCLOBENZAPRINE HCL 10 MG PO TABS: 10.0000 mg | ORAL_TABLET | Freq: Two times a day (BID) | ORAL | 0 refills | Status: DC | PRN

## 2019-09-20 MED ORDER — LACTATED RINGERS IV BOLUS
1000.0000 mL | Freq: Once | INTRAVENOUS | Status: AC
  Administered 2019-09-20: 1000 mL via INTRAVENOUS
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
## 2019-09-20 NOTE — MAU Note (Signed)
Pt left AMA. Pt signed AMA paper work and was informed that the hospital and the attending are not liable for any ill outcomes. Pt states understanding. Pt informed of signs and symptoms to return for. Pt verbalized understanding.
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
## 2019-09-20 NOTE — ED Notes (Signed)
Pt refused labs.  
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
## 2019-09-20 NOTE — MAU Note (Signed)
Pt told tech she was not going to have any blood work drawn.
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
## 2019-09-20 NOTE — MAU Note (Signed)
No longer feeling pain or tightening in abd.  Feels generalized pain.  Denies leaking or bleeding.   Reports +FM

## 2019-09-26 ENCOUNTER — Other Ambulatory Visit: Payer: Self-pay

## 2019-09-26 ENCOUNTER — Telehealth: Payer: Medicaid Other | Admitting: Student

## 2019-09-26 DIAGNOSIS — Z5329 Procedure and treatment not carried out because of patient's decision for other reasons: Secondary | ICD-10-CM

## 2019-09-26 DIAGNOSIS — Z91199 Patient's noncompliance with other medical treatment and regimen due to unspecified reason: Secondary | ICD-10-CM

## 2019-09-26 NOTE — Progress Notes (Signed)
Patient wants to be rescheduled because we called her @ 302pm after her appointment was at 235pm she states an early morning would be better.

## 2019-10-03 DIAGNOSIS — Z5329 Procedure and treatment not carried out because of patient's decision for other reasons: Secondary | ICD-10-CM | POA: Insufficient documentation

## 2019-10-03 DIAGNOSIS — Z91199 Patient's noncompliance with other medical treatment and regimen due to unspecified reason: Secondary | ICD-10-CM | POA: Insufficient documentation

## 2019-10-06 ENCOUNTER — Ambulatory Visit (HOSPITAL_COMMUNITY)
Admission: RE | Admit: 2019-10-06 | Discharge: 2019-10-06 | Disposition: A | Discharge status: Home / Self Care | Payer: Medicaid Other | Source: Ambulatory Visit | Attending: Obstetrics and Gynecology | Admitting: Obstetrics and Gynecology

## 2019-10-06 ENCOUNTER — Other Ambulatory Visit: Payer: Self-pay

## 2019-10-06 ENCOUNTER — Other Ambulatory Visit (HOSPITAL_COMMUNITY): Payer: Self-pay | Admitting: *Deleted

## 2019-10-06 ENCOUNTER — Other Ambulatory Visit (HOSPITAL_COMMUNITY): Payer: Self-pay | Admitting: Maternal & Fetal Medicine

## 2019-10-06 DIAGNOSIS — O365930 Maternal care for other known or suspected poor fetal growth, third trimester, not applicable or unspecified: Secondary | ICD-10-CM | POA: Diagnosis not present

## 2019-10-06 DIAGNOSIS — O09293 Supervision of pregnancy with other poor reproductive or obstetric history, third trimester: Secondary | ICD-10-CM

## 2019-10-06 DIAGNOSIS — Z362 Encounter for other antenatal screening follow-up: Secondary | ICD-10-CM | POA: Insufficient documentation

## 2019-10-06 DIAGNOSIS — Z3A35 35 weeks gestation of pregnancy: Secondary | ICD-10-CM | POA: Diagnosis not present

## 2019-10-06 DIAGNOSIS — O34219 Maternal care for unspecified type scar from previous cesarean delivery: Secondary | ICD-10-CM

## 2019-10-06 DIAGNOSIS — O365990 Maternal care for other known or suspected poor fetal growth, unspecified trimester, not applicable or unspecified: Secondary | ICD-10-CM

## 2019-10-09 ENCOUNTER — Inpatient Hospital Stay (HOSPITAL_COMMUNITY)
Admission: AD | Admit: 2019-10-09 | Discharge: 2019-10-09 | Disposition: A | Discharge status: Home / Self Care | Payer: Medicaid Other | Attending: Obstetrics and Gynecology | Admitting: Obstetrics and Gynecology

## 2019-10-09 ENCOUNTER — Encounter (HOSPITAL_COMMUNITY): Payer: Self-pay | Admitting: *Deleted

## 2019-10-09 ENCOUNTER — Other Ambulatory Visit: Payer: Self-pay

## 2019-10-09 DIAGNOSIS — Z3A36 36 weeks gestation of pregnancy: Secondary | ICD-10-CM | POA: Diagnosis not present

## 2019-10-09 DIAGNOSIS — O479 False labor, unspecified: Secondary | ICD-10-CM

## 2019-10-09 DIAGNOSIS — Z3689 Encounter for other specified antenatal screening: Secondary | ICD-10-CM

## 2019-10-09 LAB — URINALYSIS, ROUTINE W REFLEX MICROSCOPIC
Bilirubin Urine: NEGATIVE
Glucose, UA: NEGATIVE mg/dL
Ketones, ur: NEGATIVE mg/dL
Leukocytes,Ua: NEGATIVE
Nitrite: NEGATIVE
Protein, ur: 30 mg/dL — AB
Specific Gravity, Urine: 1.023 (ref 1.005–1.030)
pH: 6 (ref 5.0–8.0)

## 2019-10-09 NOTE — MAU Note (Signed)
.   Cynthia Hull is a 28 y.o. at [redacted]w[redacted]d here in MAU reporting: lower abdominal pain, pelvic pain and lower back pain throughout the day. Denies any VB or LOF LMP:  Onset of complaint: ongoing today Pain score: 7 Vitals:   10/09/19 1814  BP: 121/61  Pulse: 76  Resp: 16  Temp: 98.2 F (36.8 C)  SpO2: 100%     FHT:145 Lab orders placed from triage: UA

## 2019-10-09 NOTE — MAU Provider Note (Signed)
S: [name] is a 28 y.o. G3P2001 at 103w1d  who presents to MAU today for labor evaluation.     Cervical exam by RN:  Dilation: 1.5 Effacement (%): 50 Cervical Position: Posterior Station: -3 Exam by:: Mammie Lorenzo, RN  Fetal Monitoring: Baseline: 145 Variability: Moderate Accelerations: Present Decelerations: None Contractions: Q84min  MDM Discussed patient with RN. NST reviewed.   A: SIUP at [redacted]w[redacted]d  Uterine Contractions Cat I FT  P: Discharge home Labor precautions and kick counts included in AVS Patient may return to MAU as needed or when in labor   Gavin Pound, [place] 10/09/2019 9:13 PM

## 2019-10-09 NOTE — Discharge Instructions (Signed)
Cesarean Delivery Cesarean birth, or cesarean delivery, is the surgical delivery of a baby through an incision in the abdomen and the uterus. This may be referred to as a C-section. This procedure may be scheduled ahead of time, or it may be done in an emergency situation. Tell a health care provider about:  Any allergies you have.  All medicines you are taking, including vitamins, herbs, eye drops, creams, and over-the-counter medicines.  Any problems you or family members have had with anesthetic medicines.  Any blood disorders you have.  Any surgeries you have had.  Any medical conditions you have.  Whether you or any members of your family have a history of deep vein thrombosis (DVT) or pulmonary embolism (PE). What are the risks? Generally, this is a safe procedure. However, problems may occur, including:  Infection.  Bleeding.  Allergic reactions to medicines.  Damage to other structures or organs.  Blood clots.  Injury to your baby. What happens before the procedure? General instructions  Follow instructions from your health care provider about eating or drinking restrictions.  If you know that you are going to have a cesarean delivery, do not shave your pubic area. Shaving before the procedure may increase your risk of infection.  Plan to have someone take you home from the hospital.  Ask your health care provider what steps will be taken to prevent infection. These may include: ? Removing hair at the surgery site. ? Washing skin with a germ-killing soap. ? Taking antibiotic medicine.  Depending on the reason for your cesarean delivery, you may have a physical exam or additional testing, such as an ultrasound.  You may have your blood or urine tested. Questions for your health care provider  Ask your health care provider about: ? Changing or stopping your regular medicines. This is especially important if you are taking diabetes medicines or blood thinners.  ? Your pain management plan. This is especially important if you plan to breastfeed your baby. ? How long you will be in the hospital after the procedure. ? Any concerns you may have about receiving blood products, if you need them during the procedure. ? Cord blood banking, if you plan to collect your baby's umbilical cord blood.  You may also want to ask your health care provider: ? Whether you will be able to hold or breastfeed your baby while you are still in the operating room. ? Whether your baby can stay with you immediately after the procedure and during your recovery. ? Whether a family member or a person of your choice can go with you into the operating room and stay with you during the procedure, immediately after the procedure, and during your recovery. What happens during the procedure?   An IV will be inserted into one of your veins.  Fluid and medicines, such as antibiotics, will be given before the surgery.  Fetal monitors will be placed on your abdomen to check your baby's heart rate.  You may be given a special warming gown to wear to keep your temperature stable.  A catheter may be inserted into your bladder through your urethra. This drains your urine during the procedure.  You may be given one or more of the following: ? A medicine to numb the area (local anesthetic). ? A medicine to make you fall asleep (general anesthetic). ? A medicine (regional anesthetic) that is injected into your back or through a small thin tube placed in your back (spinal anesthetic or epidural anesthetic).   This numbs everything below the injection site and allows you to stay awake during your procedure. If this makes you feel nauseous, tell your health care provider. Medicines will be available to help reduce any nausea you may feel.  An incision will be made in your abdomen, and then in your uterus.  If you are awake during your procedure, you may feel tugging and pulling in your abdomen,  but you should not feel pain. If you feel pain, tell your health care provider immediately.  Your baby will be removed from your uterus. You may feel more pressure or pushing while this happens.  Immediately after birth, your baby will be dried and kept warm. You may be able to hold and breastfeed your baby.  The umbilical cord may be clamped and cut during this time. This usually occurs after waiting a period of 1-2 minutes after delivery.  Your placenta will be removed from your uterus.  Your incisions will be closed with stitches (sutures). Staples, skin glue, or adhesive strips may also be applied to the incision in your abdomen.  Bandages (dressings) may be placed over the incision in your abdomen. The procedure may vary among health care providers and hospitals. What happens after the procedure?  Your blood pressure, heart rate, breathing rate, and blood oxygen level will be monitored until you are discharged from the hospital.  You may continue to receive fluids and medicines through an IV.  You will have some pain. Medicines will be available to help control your pain.  To help prevent blood clots: ? You may be given medicines. ? You may have to wear compression stockings or devices. ? You will be encouraged to walk around when you are able.  Hospital staff will encourage and support bonding with your baby. Your hospital may have you and your baby to stay in the same room (rooming in) during your hospital stay to encourage successful bonding and breastfeeding.  You may be encouraged to cough and breathe deeply often. This helps to prevent lung problems.  If you have a catheter draining your urine, it will be removed as soon as possible after your procedure. Summary  Cesarean birth, or cesarean delivery, is the surgical delivery of a baby through an incision in the abdomen and the uterus.  Follow instructions from your health care provider about eating or drinking  restrictions before the procedure.  You will have some pain after the procedure. Medicines will be available to help control your pain.  Hospital staff will encourage and support bonding with your baby after the procedure. Your hospital may have you and your baby to stay in the same room (rooming in) during your hospital stay to encourage successful bonding and breastfeeding. This information is not intended to replace advice given to you by your health care provider. Make sure you discuss any questions you have with your health care provider. Document Released: 12/14/2005 Document Revised: 06/20/2018 Document Reviewed: 06/20/2018 Elsevier Patient Education  2020 Elsevier Inc.  

## 2019-10-10 ENCOUNTER — Encounter: Payer: Medicaid Other | Admitting: Student

## 2019-10-10 ENCOUNTER — Encounter (HOSPITAL_COMMUNITY): Payer: Self-pay

## 2019-10-10 ENCOUNTER — Ambulatory Visit (INDEPENDENT_AMBULATORY_CARE_PROVIDER_SITE_OTHER): Payer: Medicaid Other | Admitting: Student

## 2019-10-10 ENCOUNTER — Other Ambulatory Visit (HOSPITAL_COMMUNITY)
Admission: RE | Admit: 2019-10-10 | Discharge: 2019-10-10 | Disposition: A | Discharge status: Home / Self Care | Payer: Medicaid Other | Source: Ambulatory Visit | Attending: Student | Admitting: Student

## 2019-10-10 VITALS — BP 116/51 | HR 81 | Wt 147.0 lb

## 2019-10-10 DIAGNOSIS — Z3A36 36 weeks gestation of pregnancy: Secondary | ICD-10-CM | POA: Diagnosis present

## 2019-10-10 DIAGNOSIS — Z3493 Encounter for supervision of normal pregnancy, unspecified, third trimester: Secondary | ICD-10-CM

## 2019-10-10 NOTE — Progress Notes (Signed)
   PRENATAL VISIT NOTE  Subjective:  Cynthia Hull is a 28 y.o. G3P2001 at [redacted]w[redacted]d being seen today for ongoing prenatal care.  She is currently monitored for the following issues for this high-risk pregnancy and has Pregnancy with history of neonatal death; Supervision of low-risk pregnancy; H/O: C-section; Marijuana abuse; Anemia in pregnancy, third trimester; and No-show for appointment on their problem list.  Patient reports no complaints.  Contractions: Not present. Vag. Bleeding: None.  Movement: Present. Denies leaking of fluid.   The following portions of the patient's history were reviewed and updated as appropriate: allergies, current medications, past family history, past medical history, past social history, past surgical history and problem list.   Objective:   Vitals:   10/10/19 1134  BP: (!) 116/51  Pulse: 81  Weight: 147 lb (66.7 kg)    Fetal Status: Fetal Heart Rate (bpm): 152 Fundal Height: 36 cm Movement: Present     General:  Alert, oriented and cooperative. Patient is in no acute distress.  Skin: Skin is warm and dry. No rash noted.   Cardiovascular: Normal heart rate noted  Respiratory: Normal respiratory effort, no problems with respiration noted  Abdomen: Soft, gravid, appropriate for gestational age.  Pain/Pressure: Present     Pelvic: Cervical exam deferred        Extremities: Normal range of motion.  Edema: None  Mental Status: Normal mood and affect. Normal behavior. Normal judgment and thought content.   Assessment and Plan:  Pregnancy: G3P2001 at [redacted]w[redacted]d 1. [redacted] weeks gestation of pregnancy -Patient was contracting last night and seen and discharged last night after labor rule out. She feels well today.  - Culture, beta strep (group b only) - GC/Chlamydia Probe Amp  2. Encounter for supervision of low-risk pregnancy in third trimester -signed BTL papers on 10/12, may not be in time for c-section on the 11-1  Term labor symptoms and general obstetric  precautions including but not limited to vaginal bleeding, contractions, leaking of fluid and fetal movement were reviewed in detail with the patient. Please refer to After Visit Summary for other counseling recommendations.   No follow-ups on file.  Future Appointments  Date Time Provider [place]  10/13/2019 12:30 PM [place] MFC-US  10/13/2019 12:30 PM [name] 1 WH-MFCUS MFC-US  10/17/2019  2:15 PM [place], Rona Ravens, NP WOC-WOCA WOC  10/20/2019 10:00 AM WH-MFC NURSE WH-MFC MFC-US  10/20/2019 10:00 AM WH-MFC [name] 3 WH-MFCUS MFC-US  10/24/2019  1:55 PM Luvenia Redden, PA-C WOC-WOCA WOC  10/27/2019 11:15 AM WH-MFC NURSE WH-MFC MFC-US  10/27/2019 11:15 AM WH-MFC [name] 4 WH-MFCUS MFC-US  10/31/2019  1:15 PM Tresea Mall, CNM WOC-WOCA [place]  11/08/2019  2:15 PM WOC-WOCA NST WOC-WOCA WOC  11/08/2019  3:15 PM Hillard Danker, Myles Rosenthal, PA-C WOC-WOCA WOC    [place], [place]

## 2019-10-13 ENCOUNTER — Encounter (HOSPITAL_COMMUNITY): Payer: Self-pay

## 2019-10-13 ENCOUNTER — Ambulatory Visit (HOSPITAL_COMMUNITY): Payer: Medicaid Other | Admitting: *Deleted

## 2019-10-13 ENCOUNTER — Other Ambulatory Visit: Payer: Self-pay

## 2019-10-13 ENCOUNTER — Ambulatory Visit (HOSPITAL_COMMUNITY)
Admission: RE | Admit: 2019-10-13 | Discharge: 2019-10-13 | Disposition: A | Discharge status: Home / Self Care | Payer: Medicaid Other | Source: Ambulatory Visit | Attending: Obstetrics and Gynecology | Admitting: Obstetrics and Gynecology

## 2019-10-13 DIAGNOSIS — Z3493 Encounter for supervision of normal pregnancy, unspecified, third trimester: Secondary | ICD-10-CM | POA: Diagnosis present

## 2019-10-13 DIAGNOSIS — O365990 Maternal care for other known or suspected poor fetal growth, unspecified trimester, not applicable or unspecified: Secondary | ICD-10-CM | POA: Diagnosis not present

## 2019-10-13 DIAGNOSIS — O34219 Maternal care for unspecified type scar from previous cesarean delivery: Secondary | ICD-10-CM | POA: Diagnosis not present

## 2019-10-13 DIAGNOSIS — O09293 Supervision of pregnancy with other poor reproductive or obstetric history, third trimester: Secondary | ICD-10-CM | POA: Diagnosis not present

## 2019-10-13 DIAGNOSIS — O365930 Maternal care for other known or suspected poor fetal growth, third trimester, not applicable or unspecified: Secondary | ICD-10-CM

## 2019-10-13 DIAGNOSIS — Z3A36 36 weeks gestation of pregnancy: Secondary | ICD-10-CM | POA: Diagnosis not present

## 2019-10-14 LAB — CULTURE, BETA STREP (GROUP B ONLY): Strep Gp B Culture: NEGATIVE

## 2019-10-16 LAB — MOLECULAR ANCILLARY ONLY
Chlamydia: NEGATIVE
Comment: NEGATIVE
Comment: NORMAL
Neisseria Gonorrhea: NEGATIVE

## 2019-10-17 ENCOUNTER — Other Ambulatory Visit: Payer: Self-pay

## 2019-10-17 ENCOUNTER — Encounter: Payer: Self-pay | Admitting: Nurse Practitioner

## 2019-10-17 ENCOUNTER — Telehealth (INDEPENDENT_AMBULATORY_CARE_PROVIDER_SITE_OTHER): Payer: Medicaid Other | Admitting: Nurse Practitioner

## 2019-10-17 VITALS — BP 131/71 | HR 74

## 2019-10-17 DIAGNOSIS — R102 Pelvic and perineal pain: Secondary | ICD-10-CM

## 2019-10-17 DIAGNOSIS — Z3493 Encounter for supervision of normal pregnancy, unspecified, third trimester: Secondary | ICD-10-CM

## 2019-10-17 DIAGNOSIS — Z3A37 37 weeks gestation of pregnancy: Secondary | ICD-10-CM

## 2019-10-17 NOTE — Progress Notes (Signed)
   TELEHEALTH VIRTUAL OBSTETRICS VISIT ENCOUNTER NOTE  I connected with Cynthia Hull on 10/17/19 at  2:15 PM EDT by telephone at home and verified that I am speaking with the correct person using two identifiers.   I discussed the limitations, risks, security and privacy concerns of performing an evaluation and management service by telephone and the availability of in person appointments. I also discussed with the patient that there may be a patient responsible charge related to this service. The patient expressed understanding and agreed to proceed.  Subjective:  Cynthia Hull is a 28 y.o. G3P2001 at [redacted]w[redacted]d being followed for ongoing prenatal care.  She is currently monitored for the following issues for this high-risk pregnancy and has Pregnancy with history of neonatal death; Supervision of low-risk pregnancy; H/O: C-section; Marijuana abuse; Anemia in pregnancy, third trimester; and No-show for appointment on their problem list.  Patient reports no complaints. Reports fetal movement. Denies any contractions, bleeding or leaking of fluid.  Went to the hospital for contractions and was sent home.  The following portions of the patient's history were reviewed and updated as appropriate: allergies, current medications, past family history, past medical history, past social history, past surgical history and problem list.   Objective:   General:  Alert, oriented and cooperative.   Mental Status: Normal mood and affect perceived. Normal judgment and thought content.  Rest of physical exam deferred due to type of encounter  Assessment and Plan:  Pregnancy: G3P2001 at [redacted]w[redacted]d 1. Encounter for supervision of low-risk pregnancy in third trimester Has a spot that is burning on her labia. Heath Gold is moving well. Has repeat C/S scheduled   2.  Vulvar pain Was checked at the hospital and then at home she developed a spot that burns every time she urinates.  Advised she come into the office to have the  lesion checked.  Wonders if she has a latex allergy as this spot is bothersome.    Term labor symptoms and general obstetric precautions including but not limited to vaginal bleeding, contractions, leaking of fluid and fetal movement were reviewed in detail with the patient.  I discussed the assessment and treatment plan with the patient. The patient was provided an opportunity to ask questions and all were answered. The patient agreed with the plan and demonstrated an understanding of the instructions. The patient was advised to call back or seek an in-person office evaluation/go to MAU at [facility] for any urgent or concerning symptoms. Please refer to After Visit Summary for other counseling recommendations.   I provided 10 minutes of non-face-to-face time during this encounter.  Return in about 2 days (around 10/19/2019) for in person visit to see provider having a problem.  Message sent to admin staff to schedule to visuallize vulva.  Future Appointments  Date Time Provider [place]  10/20/2019 10:00 AM [place] MFC-US  10/20/2019 10:00 AM WH-MFC [name] 3 WH-MFCUS MFC-US  10/24/2019  1:55 PM Danielle Rankin [facility] WOC  10/27/2019 11:15 AM WH-MFC NURSE WH-MFC MFC-US  10/27/2019 11:15 AM WH-MFC [name] 4 WH-MFCUS MFC-US  10/31/2019  1:15 PM Tresea Mall, CNM WOC-WOCA [place]  11/08/2019  2:15 PM WOC-WOCA NST WOC-WOCA WOC  11/08/2019  3:15 PM Hillard Danker, Myles Rosenthal, PA-C WOC-WOCA [place], NP [facility] [employer] [place] [facility]

## 2019-10-17 NOTE — Progress Notes (Signed)
I connected with  Cynthia Hull on 10/17/19 at  2:15 PM EDT by telephone and verified that I am speaking with the correct person using two identifiers.   I discussed the limitations, risks, security and privacy concerns of performing an evaluation and management service by telephone and the availability of in person appointments. I also discussed with the patient that there may be a patient responsible charge related to this service. The patient expressed understanding and agreed to proceed.  [place], [place] 10/17/2019  2:05 PM

## 2019-10-19 ENCOUNTER — Telehealth (HOSPITAL_COMMUNITY): Payer: Self-pay | Admitting: *Deleted

## 2019-10-19 NOTE — Patient Instructions (Signed)
Cynthia Hull  10/19/2019   Your procedure is scheduled on:  10/29/2019  Arrive at 64 at [facility] [address] on [address] at [facility]  and [employer] [employer] [address]. You are invited to use the FREE valet parking or use the Visitor's parking deck.  Pick up the phone at the desk and dial [phone].  Call this number if you have problems the morning of surgery: [phone]  Remember:   Do not eat food:(After Midnight) Desps de medianoche.  Do not drink clear liquids: (After Midnight) Desps de medianoche.  Take these medicines the morning of surgery with A SIP OF WATER:  none   Do not wear jewelry, make-up or nail polish.  Do not wear lotions, powders, or perfumes. Do not wear deodorant.  Do not shave 48 hours prior to surgery.  Do not bring valuables to the hospital.  [facility] is not   responsible for any belongings or valuables brought to the hospital.  Contacts, dentures or bridgework may not be worn into surgery.  Leave suitcase in the car. After surgery it may be brought to your room.  For patients admitted to the hospital, checkout time is 11:00 AM the day of              discharge.      Please read over the following fact sheets that you were given:     Preparing for Surgery

## 2019-10-19 NOTE — Telephone Encounter (Signed)
Preadmission screen  

## 2019-10-20 ENCOUNTER — Ambulatory Visit (HOSPITAL_COMMUNITY): Payer: Medicaid Other | Admitting: *Deleted

## 2019-10-20 ENCOUNTER — Encounter (HOSPITAL_COMMUNITY): Payer: Self-pay

## 2019-10-20 ENCOUNTER — Other Ambulatory Visit: Payer: Self-pay

## 2019-10-20 ENCOUNTER — Telehealth (HOSPITAL_COMMUNITY): Payer: Self-pay | Admitting: *Deleted

## 2019-10-20 ENCOUNTER — Ambulatory Visit (HOSPITAL_COMMUNITY)
Admission: RE | Admit: 2019-10-20 | Discharge: 2019-10-20 | Disposition: A | Discharge status: Home / Self Care | Payer: Medicaid Other | Source: Ambulatory Visit | Attending: Obstetrics and Gynecology | Admitting: Obstetrics and Gynecology

## 2019-10-20 VITALS — BP 109/58 | HR 81 | Temp 97.9°F

## 2019-10-20 DIAGNOSIS — O365990 Maternal care for other known or suspected poor fetal growth, unspecified trimester, not applicable or unspecified: Secondary | ICD-10-CM | POA: Insufficient documentation

## 2019-10-20 DIAGNOSIS — Z3A37 37 weeks gestation of pregnancy: Secondary | ICD-10-CM

## 2019-10-20 DIAGNOSIS — O34219 Maternal care for unspecified type scar from previous cesarean delivery: Secondary | ICD-10-CM

## 2019-10-20 DIAGNOSIS — O0990 Supervision of high risk pregnancy, unspecified, unspecified trimester: Secondary | ICD-10-CM | POA: Insufficient documentation

## 2019-10-20 DIAGNOSIS — O09293 Supervision of pregnancy with other poor reproductive or obstetric history, third trimester: Secondary | ICD-10-CM | POA: Diagnosis not present

## 2019-10-20 DIAGNOSIS — O365930 Maternal care for other known or suspected poor fetal growth, third trimester, not applicable or unspecified: Secondary | ICD-10-CM

## 2019-10-20 NOTE — Telephone Encounter (Signed)
Preadmission screen  

## 2019-10-23 ENCOUNTER — Telehealth (HOSPITAL_COMMUNITY): Payer: Self-pay | Admitting: *Deleted

## 2019-10-23 ENCOUNTER — Encounter (HOSPITAL_COMMUNITY): Payer: Self-pay

## 2019-10-24 ENCOUNTER — Other Ambulatory Visit: Payer: Self-pay

## 2019-10-24 ENCOUNTER — Telehealth (INDEPENDENT_AMBULATORY_CARE_PROVIDER_SITE_OTHER): Payer: Medicaid Other | Admitting: Medical

## 2019-10-24 ENCOUNTER — Encounter: Payer: Self-pay | Admitting: Medical

## 2019-10-24 VITALS — BP 133/77 | HR 85

## 2019-10-24 DIAGNOSIS — Z3A38 38 weeks gestation of pregnancy: Secondary | ICD-10-CM

## 2019-10-24 DIAGNOSIS — Z98891 History of uterine scar from previous surgery: Secondary | ICD-10-CM

## 2019-10-24 DIAGNOSIS — Z3493 Encounter for supervision of normal pregnancy, unspecified, third trimester: Secondary | ICD-10-CM

## 2019-10-24 NOTE — Patient Instructions (Signed)
Fetal Movement Counts Patient Name: ________________________________________________ Patient Due Date: ____________________ What is a fetal movement count?  A fetal movement count is the number of times that you feel your baby move during a certain amount of time. This may also be called a fetal kick count. A fetal movement count is recommended for every pregnant woman. You may be asked to start counting fetal movements as early as week 28 of your pregnancy. Pay attention to when your baby is most active. You may notice your baby's sleep and wake cycles. You may also notice things that make your baby move more. You should do a fetal movement count:  When your baby is normally most active.  At the same time each day. A good time to count movements is while you are resting, after having something to eat and drink. How do I count fetal movements? 1. Find a quiet, comfortable area. Sit, or lie down on your side. 2. Write down the date, the start time and stop time, and the number of movements that you felt between those two times. Take this information with you to your health care visits. 3. For 2 hours, count kicks, flutters, swishes, rolls, and jabs. You should feel at least 10 movements during 2 hours. 4. You may stop counting after you have felt 10 movements. 5. If you do not feel 10 movements in 2 hours, have something to eat and drink. Then, keep resting and counting for 1 hour. If you feel at least 4 movements during that hour, you may stop counting. Contact a health care provider if:  You feel fewer than 4 movements in 2 hours.  Your baby is not moving like he or she usually does. Date: ____________ Start time: ____________ Stop time: ____________ Movements: ____________ Date: ____________ Start time: ____________ Stop time: ____________ Movements: ____________ Date: ____________ Start time: ____________ Stop time: ____________ Movements: ____________ Date: ____________ Start time:  ____________ Stop time: ____________ Movements: ____________ Date: ____________ Start time: ____________ Stop time: ____________ Movements: ____________ Date: ____________ Start time: ____________ Stop time: ____________ Movements: ____________ Date: ____________ Start time: ____________ Stop time: ____________ Movements: ____________ Date: ____________ Start time: ____________ Stop time: ____________ Movements: ____________ Date: ____________ Start time: ____________ Stop time: ____________ Movements: ____________ This information is not intended to replace advice given to you by your health care provider. Make sure you discuss any questions you have with your health care provider. Document Released: 01/13/2007 Document Revised: 01/03/2019 Document Reviewed: 01/23/2016 Elsevier Patient Education  2020 [employer] Braxton Hicks Contractions Contractions of the uterus can occur throughout pregnancy, but they are not always a sign that you are in labor. You may have practice contractions called Braxton Hicks contractions. These false labor contractions are sometimes confused with true labor. What are Braxton Hicks contractions? Braxton Hicks contractions are tightening movements that occur in the muscles of the uterus before labor. Unlike true labor contractions, these contractions do not result in opening (dilation) and thinning of the cervix. Toward the end of pregnancy (32-34 weeks), Braxton Hicks contractions can happen more often and may become stronger. These contractions are sometimes difficult to tell apart from true labor because they can be very uncomfortable. You should not feel embarrassed if you go to the hospital with false labor. Sometimes, the only way to tell if you are in true labor is for your health care provider to look for changes in the cervix. The health care provider will do a physical exam and may monitor your contractions. If you   are not in true labor, the exam should show  that your cervix is not dilating and your water has not broken. If there are no other health problems associated with your pregnancy, it is completely safe for you to be sent home with false labor. You may continue to have Braxton Hicks contractions until you go into true labor. How to tell the difference between true labor and false labor True labor  Contractions last 30-70 seconds.  Contractions become very regular.  Discomfort is usually felt in the top of the uterus, and it spreads to the lower abdomen and low back.  Contractions do not go away with walking.  Contractions usually become more intense and increase in frequency.  The cervix dilates and gets thinner. False labor  Contractions are usually shorter and not as strong as true labor contractions.  Contractions are usually irregular.  Contractions are often felt in the front of the lower abdomen and in the groin.  Contractions may go away when you walk around or change positions while lying down.  Contractions get weaker and are shorter-lasting as time goes on.  The cervix usually does not dilate or become thin. Follow these instructions at home:   Take over-the-counter and prescription medicines only as told by your health care provider.  Keep up with your usual exercises and follow other instructions from your health care provider.  Eat and drink lightly if you think you are going into labor.  If Braxton Hicks contractions are making you uncomfortable: ? Change your position from lying down or resting to walking, or change from walking to resting. ? Sit and rest in a tub of warm water. ? Drink enough fluid to keep your urine pale yellow. Dehydration may cause these contractions. ? Do slow and deep breathing several times an hour.  Keep all follow-up prenatal visits as told by your health care provider. This is important. Contact a health care provider if:  You have a fever.  You have continuous pain in  your abdomen. Get help right away if:  Your contractions become stronger, more regular, and closer together.  You have fluid leaking or gushing from your vagina.  You pass blood-tinged mucus (bloody show).  You have bleeding from your vagina.  You have low back pain that you never had before.  You feel your baby's head pushing down and causing pelvic pressure.  Your baby is not moving inside you as much as it used to. Summary  Contractions that occur before labor are called Braxton Hicks contractions, false labor, or practice contractions.  Braxton Hicks contractions are usually shorter, weaker, farther apart, and less regular than true labor contractions. True labor contractions usually become progressively stronger and regular, and they become more frequent.  Manage discomfort from Braxton Hicks contractions by changing position, resting in a warm bath, drinking plenty of water, or practicing deep breathing. This information is not intended to replace advice given to you by your health care provider. Make sure you discuss any questions you have with your health care provider. Document Released: 04/29/2017 Document Revised: 11/26/2017 Document Reviewed: 04/29/2017 Elsevier Patient Education  2020 Elsevier Inc.  

## 2019-10-24 NOTE — Progress Notes (Signed)
I connected with  Corinne Ports on 10/24/19 at  1:55 PM EDT by telephone and verified that I am speaking with the correct person using two identifiers.   I discussed the limitations, risks, security and privacy concerns of performing an evaluation and management service by telephone and the availability of in person appointments. I also discussed with the patient that there may be a patient responsible charge related to this service. The patient expressed understanding and agreed to proceed.  [place], CMA 10/24/2019  1:29 PM

## 2019-10-24 NOTE — Progress Notes (Signed)
I connected with Cynthia Hull on 10/24/19 at  1:55 PM EDT by: MyChart and verified that I am speaking with the correct person using two identifiers.  Patient is located at home and provider is located at [facility] .     The purpose of this virtual visit is to provide medical care while limiting exposure to the novel coronavirus. I discussed the limitations, risks, security and privacy concerns of performing an evaluation and management service by MyChart and the availability of in person appointments. I also discussed with the patient that there may be a patient responsible charge related to this service. By engaging in this virtual visit, you consent to the provision of healthcare.  Additionally, you authorize for your insurance to be billed for the services provided during this visit.  The patient expressed understanding and agreed to proceed.  The following staff members participated in the virtual visit:  Lowanda Foster, [place] VISIT NOTE  Subjective:  Cynthia Hull is a 28 y.o. G3P2001 at [redacted]w[redacted]d  for phone visit for ongoing prenatal care.  She is currently monitored for the following issues for this low-risk pregnancy and has Pregnancy with history of neonatal death; Supervision of low-risk pregnancy; H/O: C-section; Marijuana abuse; and Anemia in pregnancy, third trimester on their problem list.  Patient reports no complaints.  Contractions: Irritability. Vag. Bleeding: None.  Movement: Present. Denies leaking of fluid.   The following portions of the patient's history were reviewed and updated as appropriate: allergies, current medications, past family history, past medical history, past social history, past surgical history and problem list.   Objective:   Vitals:   10/24/19 1331  BP: 133/77  Pulse: 85   Self-Obtained  Fetal Status:     Movement: Present     Assessment and Plan:  Pregnancy: G3P2001 at [redacted]w[redacted]d 1. Encounter for supervision of low-risk pregnancy in third trimester  - Doing well  2. H/O: C-section - C/S is scheduled for 11/1 - Planning BTL, consent signed 10/10/19  Term labor symptoms and general obstetric precautions including but not limited to vaginal bleeding, contractions, leaking of fluid and fetal movement were reviewed in detail with the patient.  Return in about 5 weeks (around 11/28/2019) for PP visit, Virtual.  Future Appointments  Date Time Provider [place]  10/24/2019  1:55 PM Danielle Rankin [facility]  10/27/2019  8:30 AM MC-MAU 1 MC-INDC None  10/27/2019 11:15 AM WH-MFC NURSE [place] MFC-US  10/27/2019 11:15 AM WH-MFC [name] 4 WH-MFCUS MFC-US  10/31/2019  1:15 PM Tresea Mall, CNM WOC-WOCA [place]  11/08/2019  2:15 PM WOC-WOCA NST WOC-WOCA WOC  11/08/2019  3:15 PM Hillard Danker, Myles Rosenthal, PA-C WOC-WOCA WOC     Time spent on virtual visit: 10 minutes  Kerry Hough, PA-C

## 2019-10-27 ENCOUNTER — Other Ambulatory Visit (HOSPITAL_COMMUNITY): Payer: Self-pay | Admitting: Obstetrics

## 2019-10-27 ENCOUNTER — Ambulatory Visit (HOSPITAL_COMMUNITY)
Admission: RE | Admit: 2019-10-27 | Discharge: 2019-10-27 | Disposition: A | Discharge status: Home / Self Care | Payer: Medicaid Other | Source: Ambulatory Visit | Attending: Obstetrics and Gynecology | Admitting: Obstetrics and Gynecology

## 2019-10-27 ENCOUNTER — Other Ambulatory Visit: Payer: Self-pay

## 2019-10-27 ENCOUNTER — Ambulatory Visit (HOSPITAL_COMMUNITY): Payer: Medicaid Other | Admitting: *Deleted

## 2019-10-27 ENCOUNTER — Encounter (HOSPITAL_COMMUNITY): Payer: Self-pay | Admitting: *Deleted

## 2019-10-27 ENCOUNTER — Other Ambulatory Visit (HOSPITAL_COMMUNITY)
Admission: RE | Admit: 2019-10-27 | Discharge: 2019-10-27 | Disposition: A | Discharge status: Home / Self Care | Payer: Medicaid Other | Source: Ambulatory Visit | Attending: Obstetrics & Gynecology | Admitting: Obstetrics & Gynecology

## 2019-10-27 DIAGNOSIS — O09299 Supervision of pregnancy with other poor reproductive or obstetric history, unspecified trimester: Secondary | ICD-10-CM | POA: Diagnosis not present

## 2019-10-27 DIAGNOSIS — O365930 Maternal care for other known or suspected poor fetal growth, third trimester, not applicable or unspecified: Secondary | ICD-10-CM

## 2019-10-27 DIAGNOSIS — Z20828 Contact with and (suspected) exposure to other viral communicable diseases: Secondary | ICD-10-CM | POA: Diagnosis not present

## 2019-10-27 DIAGNOSIS — O365990 Maternal care for other known or suspected poor fetal growth, unspecified trimester, not applicable or unspecified: Secondary | ICD-10-CM | POA: Diagnosis present

## 2019-10-27 DIAGNOSIS — Z3A38 38 weeks gestation of pregnancy: Secondary | ICD-10-CM | POA: Diagnosis not present

## 2019-10-27 DIAGNOSIS — Z362 Encounter for other antenatal screening follow-up: Secondary | ICD-10-CM

## 2019-10-27 DIAGNOSIS — O34219 Maternal care for unspecified type scar from previous cesarean delivery: Secondary | ICD-10-CM | POA: Diagnosis not present

## 2019-10-27 DIAGNOSIS — Z3493 Encounter for supervision of normal pregnancy, unspecified, third trimester: Secondary | ICD-10-CM

## 2019-10-27 LAB — CBC
HCT: 29.8 % — ABNORMAL LOW (ref 36.0–46.0)
Hemoglobin: 9.6 g/dL — ABNORMAL LOW (ref 12.0–15.0)
MCH: 27.2 pg (ref 26.0–34.0)
MCHC: 32.2 g/dL (ref 30.0–36.0)
MCV: 84.4 fL (ref 80.0–100.0)
Platelets: 358 10*3/uL (ref 150–400)
RBC: 3.53 MIL/uL — ABNORMAL LOW (ref 3.87–5.11)
RDW: 14.6 % (ref 11.5–15.5)
WBC: 12.3 10*3/uL — ABNORMAL HIGH (ref 4.0–10.5)
nRBC: 0 % (ref 0.0–0.2)

## 2019-10-27 LAB — TYPE AND SCREEN
ABO/RH(D): O POS
Antibody Screen: NEGATIVE

## 2019-10-27 LAB — SARS CORONAVIRUS 2 (TAT 6-24 HRS): SARS Coronavirus 2: NEGATIVE

## 2019-10-27 LAB — RPR: RPR Ser Ql: NONREACTIVE

## 2019-10-27 NOTE — MAU Note (Signed)
Asymptomatic, swab collected. 

## 2019-10-29 ENCOUNTER — Inpatient Hospital Stay (HOSPITAL_COMMUNITY): Payer: Medicaid Other | Admitting: Anesthesiology

## 2019-10-29 ENCOUNTER — Other Ambulatory Visit: Payer: Self-pay

## 2019-10-29 ENCOUNTER — Encounter (HOSPITAL_COMMUNITY): Payer: Self-pay | Admitting: *Deleted

## 2019-10-29 ENCOUNTER — Inpatient Hospital Stay (HOSPITAL_COMMUNITY)
Admission: AD | Admit: 2019-10-29 | Discharge: 2019-10-31 | DRG: 785 | Disposition: A | Discharge status: Home / Self Care | Payer: Medicaid Other | Attending: Obstetrics & Gynecology | Admitting: Obstetrics & Gynecology

## 2019-10-29 ENCOUNTER — Encounter (HOSPITAL_COMMUNITY)
Admission: AD | Disposition: A | Discharge status: Home / Self Care | Payer: Self-pay | Source: Home / Self Care | Attending: Obstetrics & Gynecology

## 2019-10-29 DIAGNOSIS — O34219 Maternal care for unspecified type scar from previous cesarean delivery: Secondary | ICD-10-CM | POA: Diagnosis present

## 2019-10-29 DIAGNOSIS — O34211 Maternal care for low transverse scar from previous cesarean delivery: Secondary | ICD-10-CM | POA: Diagnosis present

## 2019-10-29 DIAGNOSIS — D649 Anemia, unspecified: Secondary | ICD-10-CM | POA: Diagnosis present

## 2019-10-29 DIAGNOSIS — O9902 Anemia complicating childbirth: Secondary | ICD-10-CM | POA: Diagnosis present

## 2019-10-29 DIAGNOSIS — O09299 Supervision of pregnancy with other poor reproductive or obstetric history, unspecified trimester: Secondary | ICD-10-CM

## 2019-10-29 DIAGNOSIS — Z20828 Contact with and (suspected) exposure to other viral communicable diseases: Secondary | ICD-10-CM | POA: Diagnosis present

## 2019-10-29 DIAGNOSIS — O9081 Anemia of the puerperium: Secondary | ICD-10-CM | POA: Diagnosis present

## 2019-10-29 DIAGNOSIS — Z3A39 39 weeks gestation of pregnancy: Secondary | ICD-10-CM | POA: Diagnosis not present

## 2019-10-29 DIAGNOSIS — Z98891 History of uterine scar from previous surgery: Secondary | ICD-10-CM

## 2019-10-29 DIAGNOSIS — Z302 Encounter for sterilization: Secondary | ICD-10-CM

## 2019-10-29 DIAGNOSIS — Z87891 Personal history of nicotine dependence: Secondary | ICD-10-CM | POA: Diagnosis not present

## 2019-10-29 DIAGNOSIS — Z9889 Other specified postprocedural states: Secondary | ICD-10-CM

## 2019-10-29 SURGERY — Surgical Case
Anesthesia: Spinal | Site: Abdomen

## 2019-10-29 MED ORDER — ZOLPIDEM TARTRATE 5 MG PO TABS: 5.0000 mg | ORAL_TABLET | Freq: Every evening | ORAL | Status: DC | PRN

## 2019-10-29 MED ORDER — MENTHOL 3 MG MT LOZG: 1.0000 | LOZENGE | OROMUCOSAL | Status: DC | PRN

## 2019-10-29 MED ORDER — ONDANSETRON HCL 4 MG/2ML IJ SOLN
INTRAMUSCULAR | Status: DC | PRN
  Administered 2019-10-29: 4 mg via INTRAVENOUS

## 2019-10-29 MED ORDER — OXYTOCIN 40 UNITS IN NORMAL SALINE INFUSION - SIMPLE MED
2.5000 [IU]/h | INTRAVENOUS | Status: AC
  Administered 2019-10-29: 2.5 [IU]/h via INTRAVENOUS
  Filled 2019-10-29: qty 1000

## 2019-10-29 MED ORDER — FENTANYL CITRATE (PF) 100 MCG/2ML IJ SOLN: 25.0000 ug | INTRAMUSCULAR | Status: DC | PRN

## 2019-10-29 MED ORDER — OXYTOCIN 40 UNITS IN NORMAL SALINE INFUSION - SIMPLE MED
INTRAVENOUS | Status: DC | PRN
  Administered 2019-10-29: 40 [IU] via INTRAVENOUS

## 2019-10-29 MED ORDER — LACTATED RINGERS IV SOLN
INTRAVENOUS | Status: DC
  Administered 2019-10-29 (×3): via INTRAVENOUS

## 2019-10-29 MED ORDER — PHENYLEPHRINE 40 MCG/ML (10ML) SYRINGE FOR IV PUSH (FOR BLOOD PRESSURE SUPPORT)
PREFILLED_SYRINGE | INTRAVENOUS | Status: AC
  Filled 2019-10-29: qty 10

## 2019-10-29 MED ORDER — SODIUM CHLORIDE 0.9 % IV SOLN
2.0000 g | INTRAVENOUS | Status: AC
  Administered 2019-10-29: 2 g via INTRAVENOUS

## 2019-10-29 MED ORDER — DEXAMETHASONE SODIUM PHOSPHATE 10 MG/ML IJ SOLN
INTRAMUSCULAR | Status: DC | PRN
  Administered 2019-10-29: 10 mg via INTRAVENOUS

## 2019-10-29 MED ORDER — SENNOSIDES-DOCUSATE SODIUM 8.6-50 MG PO TABS
2.0000 | ORAL_TABLET | ORAL | Status: DC
  Administered 2019-10-29 – 2019-10-31 (×2): 2 via ORAL
  Filled 2019-10-29 (×3): qty 2

## 2019-10-29 MED ORDER — ONDANSETRON HCL 4 MG/2ML IJ SOLN
INTRAMUSCULAR | Status: AC
  Filled 2019-10-29: qty 2

## 2019-10-29 MED ORDER — WITCH HAZEL-GLYCERIN EX PADS
1.0000 "application " | MEDICATED_PAD | CUTANEOUS | Status: DC | PRN
  Administered 2019-10-31: 1 via TOPICAL

## 2019-10-29 MED ORDER — ACETAMINOPHEN 500 MG PO TABS: 1000.0000 mg | ORAL_TABLET | ORAL | Status: DC

## 2019-10-29 MED ORDER — TETANUS-DIPHTH-ACELL PERTUSSIS 5-2.5-18.5 LF-MCG/0.5 IM SUSP: 0.5000 mL | Freq: Once | INTRAMUSCULAR | Status: DC

## 2019-10-29 MED ORDER — SODIUM CHLORIDE 0.9 % IR SOLN
Status: DC | PRN
  Administered 2019-10-29: 1

## 2019-10-29 MED ORDER — PRENATAL MULTIVITAMIN CH
1.0000 | ORAL_TABLET | Freq: Every day | ORAL | Status: DC
  Administered 2019-10-30 – 2019-10-31 (×2): 1 via ORAL
  Filled 2019-10-29 (×2): qty 1

## 2019-10-29 MED ORDER — PHENYLEPHRINE HCL-NACL 20-0.9 MG/250ML-% IV SOLN
INTRAVENOUS | Status: AC
  Filled 2019-10-29: qty 250

## 2019-10-29 MED ORDER — NALBUPHINE HCL 10 MG/ML IJ SOLN
INTRAMUSCULAR | Status: AC
  Filled 2019-10-29: qty 1

## 2019-10-29 MED ORDER — LACTATED RINGERS IV SOLN
INTRAVENOUS | Status: DC
  Administered 2019-10-29 (×2): via INTRAVENOUS

## 2019-10-29 MED ORDER — IBUPROFEN 600 MG PO TABS: 600.0000 mg | ORAL_TABLET | Freq: Four times a day (QID) | ORAL | 1 refills | Status: AC | PRN

## 2019-10-29 MED ORDER — GABAPENTIN 300 MG PO CAPS: 300.0000 mg | ORAL_CAPSULE | ORAL | Status: DC

## 2019-10-29 MED ORDER — KETOROLAC TROMETHAMINE 30 MG/ML IJ SOLN: 30.0000 mg | Freq: Once | INTRAMUSCULAR | Status: DC | PRN

## 2019-10-29 MED ORDER — OXYCODONE-ACETAMINOPHEN 5-325 MG PO TABS: 1.0000 | ORAL_TABLET | Freq: Four times a day (QID) | ORAL | 0 refills | Status: DC | PRN

## 2019-10-29 MED ORDER — BUPIVACAINE HCL (PF) 0.5 % IJ SOLN
INTRAMUSCULAR | Status: DC | PRN
  Administered 2019-10-29: 30 mL

## 2019-10-29 MED ORDER — DIPHENHYDRAMINE HCL 25 MG PO CAPS: 25.0000 mg | ORAL_CAPSULE | ORAL | Status: DC | PRN

## 2019-10-29 MED ORDER — MORPHINE SULFATE (PF) 0.5 MG/ML IJ SOLN
INTRAMUSCULAR | Status: DC | PRN
  Administered 2019-10-29: .15 mg via EPIDURAL

## 2019-10-29 MED ORDER — ACETAMINOPHEN 10 MG/ML IV SOLN
1000.0000 mg | Freq: Once | INTRAVENOUS | Status: DC | PRN
  Administered 2019-10-29: 11:00:00 1000 mg via INTRAVENOUS

## 2019-10-29 MED ORDER — SIMETHICONE 80 MG PO CHEW: 80.0000 mg | CHEWABLE_TABLET | ORAL | Status: DC | PRN

## 2019-10-29 MED ORDER — COCONUT OIL OIL: 1.0000 "application " | TOPICAL_OIL | Status: DC | PRN

## 2019-10-29 MED ORDER — SOD CITRATE-CITRIC ACID 500-334 MG/5ML PO SOLN
ORAL | Status: AC
  Filled 2019-10-29: qty 30

## 2019-10-29 MED ORDER — STERILE WATER FOR IRRIGATION IR SOLN
Status: DC | PRN
  Administered 2019-10-29: 1000 mL

## 2019-10-29 MED ORDER — SIMETHICONE 80 MG PO CHEW
80.0000 mg | CHEWABLE_TABLET | Freq: Three times a day (TID) | ORAL | Status: DC
  Administered 2019-10-29 – 2019-10-31 (×6): 80 mg via ORAL
  Filled 2019-10-29 (×6): qty 1

## 2019-10-29 MED ORDER — DIPHENHYDRAMINE HCL 50 MG/ML IJ SOLN: 12.5000 mg | INTRAMUSCULAR | Status: DC | PRN

## 2019-10-29 MED ORDER — SCOPOLAMINE 1 MG/3DAYS TD PT72
1.0000 | MEDICATED_PATCH | Freq: Once | TRANSDERMAL | Status: DC
  Administered 2019-10-29: 1.5 mg via TRANSDERMAL

## 2019-10-29 MED ORDER — MORPHINE SULFATE (PF) 0.5 MG/ML IJ SOLN
INTRAMUSCULAR | Status: AC
  Filled 2019-10-29: qty 10

## 2019-10-29 MED ORDER — SOD CITRATE-CITRIC ACID 500-334 MG/5ML PO SOLN
30.0000 mL | ORAL | Status: AC
  Administered 2019-10-29: 09:00:00 30 mL via ORAL

## 2019-10-29 MED ORDER — ACETAMINOPHEN 10 MG/ML IV SOLN
INTRAVENOUS | Status: AC
  Filled 2019-10-29: qty 100

## 2019-10-29 MED ORDER — ACETAMINOPHEN 500 MG PO TABS: 1000.0000 mg | ORAL_TABLET | Freq: Four times a day (QID) | ORAL | 0 refills | Status: AC | PRN

## 2019-10-29 MED ORDER — FENTANYL CITRATE (PF) 100 MCG/2ML IJ SOLN
INTRAMUSCULAR | Status: DC | PRN
  Administered 2019-10-29: 25 ug via INTRAVENOUS
  Administered 2019-10-29: 15 ug via INTRAVENOUS
  Administered 2019-10-29: 25 ug via INTRAVENOUS
  Administered 2019-10-29: 35 ug via INTRAVENOUS

## 2019-10-29 MED ORDER — BUPIVACAINE IN DEXTROSE 0.75-8.25 % IT SOLN
INTRATHECAL | Status: DC | PRN
  Administered 2019-10-29: 13.5 mg via INTRATHECAL

## 2019-10-29 MED ORDER — DIBUCAINE (PERIANAL) 1 % EX OINT
1.0000 "application " | TOPICAL_OINTMENT | CUTANEOUS | Status: DC | PRN
  Administered 2019-10-31: 1 via RECTAL
  Filled 2019-10-29: qty 28

## 2019-10-29 MED ORDER — OXYTOCIN 40 UNITS IN NORMAL SALINE INFUSION - SIMPLE MED
INTRAVENOUS | Status: AC
  Filled 2019-10-29: qty 1000

## 2019-10-29 MED ORDER — SODIUM CHLORIDE 0.9 % IV SOLN
INTRAVENOUS | Status: AC
  Filled 2019-10-29: qty 2

## 2019-10-29 MED ORDER — PHENYLEPHRINE 40 MCG/ML (10ML) SYRINGE FOR IV PUSH (FOR BLOOD PRESSURE SUPPORT)
PREFILLED_SYRINGE | INTRAVENOUS | Status: DC | PRN
  Administered 2019-10-29 (×2): 160 ug via INTRAVENOUS

## 2019-10-29 MED ORDER — DIPHENHYDRAMINE HCL 25 MG PO CAPS: 25.0000 mg | ORAL_CAPSULE | Freq: Four times a day (QID) | ORAL | Status: DC | PRN

## 2019-10-29 MED ORDER — SODIUM CHLORIDE 0.9 % IV SOLN
INTRAVENOUS | Status: DC | PRN
  Administered 2019-10-29: 10:00:00 via INTRAVENOUS

## 2019-10-29 MED ORDER — NALBUPHINE HCL 10 MG/ML IJ SOLN
5.0000 mg | INTRAMUSCULAR | Status: DC | PRN
  Filled 2019-10-29: qty 0.5

## 2019-10-29 MED ORDER — PHENYLEPHRINE HCL-NACL 20-0.9 MG/250ML-% IV SOLN
INTRAVENOUS | Status: DC | PRN
  Administered 2019-10-29: 60 ug/min via INTRAVENOUS

## 2019-10-29 MED ORDER — BUPIVACAINE HCL (PF) 0.5 % IJ SOLN
INTRAMUSCULAR | Status: AC
  Filled 2019-10-29: qty 30

## 2019-10-29 MED ORDER — FENTANYL CITRATE (PF) 100 MCG/2ML IJ SOLN
INTRAMUSCULAR | Status: AC
  Filled 2019-10-29: qty 2

## 2019-10-29 MED ORDER — SIMETHICONE 80 MG PO CHEW
80.0000 mg | CHEWABLE_TABLET | ORAL | Status: DC
  Administered 2019-10-29 – 2019-10-31 (×2): 80 mg via ORAL
  Filled 2019-10-29 (×2): qty 1

## 2019-10-29 MED ORDER — IBUPROFEN 800 MG PO TABS
800.0000 mg | ORAL_TABLET | Freq: Three times a day (TID) | ORAL | Status: DC
  Administered 2019-10-29 – 2019-10-31 (×6): 800 mg via ORAL
  Filled 2019-10-29 (×6): qty 1

## 2019-10-29 MED ORDER — OXYCODONE HCL 5 MG PO TABS
5.0000 mg | ORAL_TABLET | ORAL | Status: DC | PRN
  Administered 2019-10-30 – 2019-10-31 (×3): 10 mg via ORAL
  Filled 2019-10-29 (×3): qty 2

## 2019-10-29 MED ORDER — NALBUPHINE HCL 10 MG/ML IJ SOLN
5.0000 mg | INTRAMUSCULAR | Status: DC | PRN
  Administered 2019-10-29: 5 mg via INTRAVENOUS
  Filled 2019-10-29: qty 0.5

## 2019-10-29 MED ORDER — ACETAMINOPHEN 500 MG PO TABS
1000.0000 mg | ORAL_TABLET | Freq: Four times a day (QID) | ORAL | Status: DC
  Administered 2019-10-29 – 2019-10-31 (×8): 1000 mg via ORAL
  Filled 2019-10-29 (×8): qty 2

## 2019-10-29 MED ORDER — SCOPOLAMINE 1 MG/3DAYS TD PT72
MEDICATED_PATCH | TRANSDERMAL | Status: AC
  Filled 2019-10-29: qty 1

## 2019-10-29 SURGICAL SUPPLY — 35 items
BARRIER ADHS 3X4 INTERCEED (GAUZE/BANDAGES/DRESSINGS) IMPLANT
CHLORAPREP W/TINT 26ML (MISCELLANEOUS) ×2 IMPLANT
CLAMP CORD UMBIL (MISCELLANEOUS) IMPLANT
CLIP FILSHIE TUBAL LIGA STRL (Clip) ×2 IMPLANT
DRSG OPSITE POSTOP 4X10 (GAUZE/BANDAGES/DRESSINGS) ×2 IMPLANT
ELECT REM PT RETURN 9FT ADLT (ELECTROSURGICAL) ×2
ELECTRODE REM PT RTRN 9FT ADLT (ELECTROSURGICAL) ×1 IMPLANT
EXTRACTOR VACUUM KIWI (MISCELLANEOUS) IMPLANT
GLOVE BIO SURGEON STRL SZ 6.5 (GLOVE) ×2 IMPLANT
GLOVE BIOGEL PI IND STRL 7.0 (GLOVE) ×1 IMPLANT
GLOVE BIOGEL PI INDICATOR 7.0 (GLOVE) ×1
GOWN STRL REUS W/ TWL LRG LVL3 (GOWN DISPOSABLE) ×2 IMPLANT
GOWN STRL REUS W/TWL LRG LVL3 (GOWN DISPOSABLE) ×2
KIT ABG SYR 3ML LUER SLIP (SYRINGE) IMPLANT
NEEDLE HYPO 25X5/8 SAFETYGLIDE (NEEDLE) IMPLANT
NEEDLE SPNL 18GX3.5 QUINCKE PK (NEEDLE) ×2 IMPLANT
NS IRRIG 1000ML POUR BTL (IV SOLUTION) ×2 IMPLANT
PACK C SECTION WH (CUSTOM PROCEDURE TRAY) ×2 IMPLANT
PAD OB MATERNITY 4.3X12.25 (PERSONAL CARE ITEMS) ×2 IMPLANT
PENCIL SMOKE EVAC W/HOLSTER (ELECTROSURGICAL) ×2 IMPLANT
SPONGE LAP 18X18 RF (DISPOSABLE) ×6 IMPLANT
SUT PDS AB 0 CTX 60 (SUTURE) IMPLANT
SUT VIC AB 0 CT1 27 (SUTURE)
SUT VIC AB 0 CT1 27XBRD ANBCTR (SUTURE) IMPLANT
SUT VIC AB 0 CT1 36 (SUTURE) IMPLANT
SUT VIC AB 2-0 CT1 27 (SUTURE) ×1
SUT VIC AB 2-0 CT1 TAPERPNT 27 (SUTURE) ×1 IMPLANT
SUT VIC AB 2-0 CTX 36 (SUTURE) ×4 IMPLANT
SUT VIC AB 3-0 CT1 27 (SUTURE) ×1
SUT VIC AB 3-0 CT1 TAPERPNT 27 (SUTURE) ×1 IMPLANT
SUT VIC AB 3-0 SH 27 (SUTURE)
SUT VIC AB 3-0 SH 27X BRD (SUTURE) IMPLANT
SYR 30ML LL (SYRINGE) ×2 IMPLANT
TOWEL OR 17X24 6PK STRL BLUE (TOWEL DISPOSABLE) ×2 IMPLANT
TRAY FOLEY BAG SILVER LF 14FR (SET/KITS/TRAYS/PACK) ×2 IMPLANT

## 2019-10-29 NOTE — Anesthesia Postprocedure Evaluation (Signed)
Anesthesia Post Note  Patient: Mumtaz Nafziger  Procedure(s) Performed: CESAREAN SECTION (N/A Abdomen)     Patient location during evaluation: PACU Anesthesia Type: Spinal Level of consciousness: oriented and awake and alert Pain management: pain level controlled Vital Signs Assessment: post-procedure vital signs reviewed and stable Respiratory status: spontaneous breathing, respiratory function stable and patient connected to nasal cannula oxygen Cardiovascular status: blood pressure returned to baseline and stable Postop Assessment: no headache, no backache and no apparent nausea or vomiting Anesthetic complications: no    Last Vitals:  Vitals:   10/29/19 1215 10/29/19 1216  BP:    Pulse:  69  Resp: 13 19  Temp:    SpO2:  98%    Last Pain:  Vitals:   10/29/19 1155  TempSrc: Axillary  PainSc: 4    Pain Goal:                Epidural/Spinal Function Cutaneous sensation: Vague (10/29/19 1145), Patient able to flex knees: No(rocks legs) (10/29/19 1145), Patient able to lift hips off bed: No (10/29/19 1145), Back pain beyond tenderness at insertion site: No (10/29/19 1145), Progressively worsening motor and/or sensory loss: No (10/29/19 1145), Bowel and/or bladder incontinence post epidural: No (10/29/19 1145)  Fancy Dunkley L Dinesha Twiggs

## 2019-10-29 NOTE — Discharge Summary (Addendum)
Postpartum Discharge Summary     Patient Name: Cynthia Hull DOB: 05-08-1991 MRN: [id number]  Date of admission: 10/29/2019 Delivering Provider: Emily Filbert   Date of discharge: 10/31/2019  Admitting diagnosis: RCS Intrauterine pregnancy: [redacted]w[redacted]d    Secondary diagnosis:  Active Problems:   Pregnancy with history of neonatal death   Anemia in pregnancy, third trimester   Status post cesarean section   [redacted] weeks gestation of pregnancy   Post-operative state  unwanted fertility  Additional problems:      Discharge diagnosis: Term Pregnancy Delivered, anemia                                                                                               Post partum procedures:postpartum tubal ligation   Complications: None  Hospital course:  Sceduled C/S   28y.o. yo G3P2001 at 367w0das admitted to the hospital 10/29/2019 for scheduled cesarean section with the following indication:Elective Repeat.   Membrane Rupture Time/Date: 10:16 AM ,10/29/2019   Patient delivered a Viable infant.10/29/2019  Details of operation can be found in separate operative note.    Pateint had an uncomplicated postpartum course. Received IV iron inpatient and to continue oral iron on discharge. She is ambulating, tolerating a regular diet, passing flatus, and urinating well. Patient is discharged home in stable condition on  10/31/19. See note from SW re pt tearful on day of d/c- Zoloft 2560md sent to pharmacy- and referrals for f/u were made by SW.        Delivery time: 10:17 AM    Magnesium Sulfate received: No BMZ received: No Rhophylac:No MMR:No Transfusion:No  Physical exam  Vitals:   10/30/19 0603 10/30/19 1100 10/30/19 2259 10/31/19 0500  BP: (!) 102/51 103/63 (!) 115/57 (!) 105/50  Pulse: (!) 57 62 63 69  Resp: 18 18    Temp: 98.3 F (36.8 C)  97.8 F (36.6 C) 98 F (36.7 C)  TempSrc: Oral  Oral Oral  SpO2: 100% 100% 100% 100%  Weight:      Height:       General: alert Lochia:  appropriate Uterine Fundus: firm Incision: Healing well with no significant drainage, No significant erythema DVT Evaluation: No evidence of DVT seen on physical exam. Labs: Lab Results  Component Value Date   WBC 21.9 (H) 10/30/2019   HGB 7.4 (L) 10/30/2019   HCT 23.0 (L) 10/30/2019   MCV 84.6 10/30/2019   PLT 261 10/30/2019   CMP Latest Ref Rng & Units 03/13/2019  Glucose 70 - 99 mg/dL 91  BUN 6 - 20 mg/dL <5(L)  Creatinine 0.44 - 1.00 mg/dL 0.72  Sodium 135 - 145 mmol/L 136  Potassium 3.5 - 5.1 mmol/L 3.4(L)  Chloride 98 - 111 mmol/L 103  CO2 22 - 32 mmol/L 23  Calcium 8.9 - 10.3 mg/dL 8.4(L)  Total Protein 6.5 - 8.1 g/dL 6.8  Total Bilirubin 0.3 - 1.2 mg/dL 0.5  Alkaline Phos 38 - 126 U/L 48  AST 15 - 41 U/L 7(L)  ALT 0 - 44 U/L 12    Discharge instruction: per After Visit Summary  and "Baby and Me Booklet".  After visit meds:   Percocet #30  Diet: routine diet  Activity: Advance as tolerated. Pelvic rest for 6 weeks.   Outpatient follow up:2 weeks Follow up Appt: Future Appointments  Date Time Provider [place]  11/13/2019  9:20 AM [place]  11/27/2019  1:15 PM Darlina Rumpf, [place]   Follow up Visit: [place] [facility]   Specialty: Obstetrics and Gynecology Why: 2 weeks for incision check and 6 weeks for postpartum exam Contact information: [address] [place], [place] [id number] [place] [phone]           Please schedule this patient for Postpartum visit in: 6 weeks with the following provider: Any provider For C/S patients schedule nurse incision check in weeks 2 weeks: yes Low risk pregnancy complicated by: none Delivery mode:  CS Anticipated Birth Control:  BTL done PP PP Procedures needed: Incision check  Schedule Integrated BH visit: no   Newborn Data: Live born female  Birth Weight:  2910  g APGAR: 1 min 9 , 5 min 9  Newborn Delivery   Birth date/time: 10/29/2019 10:17:00 Delivery type: C-Section, Low Transverse Trial of labor: No C-section categorization: Repeat      Baby Feeding: Breast Disposition:home with mother   10/31/2019 Maxie Better MD, PGY1 OBGYN Faculty Service   I saw and evaluated the patient. I agree with the findings and the plan of care as documented in the resident's note.  Barrington Ellison, MD [facility] Family Medicine Fellow, [facility] for [employer], [place]

## 2019-10-29 NOTE — H&P (Signed)
Obstetric Preoperative History and Physical  Cynthia Hull is a 28 y.o. G3P2001 with IUP at 9w0dpresenting for scheduled cesarean section.  Reports good fetal movement, no bleeding, no contractions, no leaking of fluid.  No acute preoperative concerns.    Cesarean Section Indication: repeat  Prenatal Course Source of Care: [place]  with onset of care at 18 weeks Pregnancy complications or risks: Patient Active Problem List   Diagnosis Date Noted  . Status post cesarean section 10/29/2019  . Anemia in pregnancy, third trimester 09/11/2019  . H/O: C-section 06/07/2019  . Marijuana abuse 06/07/2019  . Supervision of low-risk pregnancy 05/19/2019  . Pregnancy with history of neonatal death 005-01-18  She plans to breastfeed She desires bilateral tubal ligation for postpartum contraception.   Prenatal labs and studies: ABO, Rh: --/--/O POS (10/30 09407 Antibody: NEG (10/30 0839) Rubella: 1.24 (06/10 1043) RPR: NON REACTIVE (10/30 0839)  HBsAg: Negative (06/10 1043)  HIV: Non Reactive (08/28 0915)  GWKG:SUPJSRPR/- (10/13 1219) 2 hr Glucola  WNL Genetic screening NIPS low risk Anatomy [place] Echogenic intracardiac focus  -An appointment was made for her to return in 4 weeks for  completion of fetal anatomy.  -Serial fetal growth every 4 weeks.  -Weekly antenatal testing (BPP) from 36 weeks till delivery.  Prenatal Transfer Tool  Maternal Diabetes: No Genetic Screening: Normal Maternal Ultrasounds/Referrals: Normal Fetal Ultrasounds or other Referrals:  None Maternal Substance Abuse:  No Significant Maternal Medications:  None Significant Maternal Lab Results: Group B Strep negative  Past Medical History:  Diagnosis Date  . Anemia   . Headache    as child  . Infection    UTI    Past Surgical History:  Procedure Laterality Date  . CESAREAN SECTION N/A 10/22/2017   Procedure: CESAREAN SECTION;  Surgeon: PAletha Halim MD;  [place]  Service:  Obstetrics;  Laterality: N/A;  . NO PAST SURGERIES      OB History  Gravida Para Term Preterm AB Living  _0 0 0 1  SAB TAB Ectopic Multiple Live Births  0 0 0 0 2    # Outcome Date GA Lbr Len/2nd Weight Sex Delivery Anes PTL Lv  3 Current           2 Term 10/22/17 [id number] g M CS-LTranv Spinal  LIV     Birth Comments: bilateral postaxial polydactyly  1 Term 2017    M VBAC, Vacuum   ND    Social History   Socioeconomic History  . Marital status: Single    Spouse name: Not on file  . Number of children: Not on file  . Years of education: Not on file  . Highest education level: Not on file  Occupational History  . Not on file  Social Needs  . Financial resource strain: Not on file  . Food insecurity    Worry: Never true    Inability: Never true  . Transportation needs    Medical: No    Non-medical: No  Tobacco Use  . Smoking status: Former Smoker    Years: 1.00    Types: Cigars  . Smokeless tobacco: Never Used  . Tobacco comment: quit with +preg  Substance and Sexual Activity  . Alcohol use: Not Currently  . Drug use: Not Currently    Types: Marijuana    Comment: March 2020  . Sexual activity: Yes    Birth control/protection: None  Lifestyle  . Physical activity    Days  per week: Not on file    Minutes per session: Not on file  . Stress: Not on file  Relationships  . Social Herbalist on phone: Not on file    Gets together: Not on file    Attends religious service: Not on file    Active member of club or organization: Not on file    Attends meetings of clubs or organizations: Not on file    Relationship status: Not on file  Other Topics Concern  . Not on file  Social History Narrative  . Not on file    Family History  Problem Relation Age of Onset  . Hypertension Mother   . Healthy Father     Medications Prior to Admission  Medication Sig Dispense Refill Last Dose  . ferrous sulfate (FERROUSUL) 325 (65 FE) MG tablet Take 1 tablet  (325 mg total) by mouth 2 (two) times daily. (Patient taking differently: Take 325 mg by mouth as needed (Tired). ) 60 tablet 1   . Prenatal Vit-Fe Fumarate-FA (PRENATAL PO) Take 1 tablet by mouth daily.      . AMBULATORY NON FORMULARY MEDICATION 1 Device by Other route once a week. Blood Pressure Cuff Medium Monitored Regularly at home ICD 10:Z34.90 1 kit 0   . cyclobenzaprine (FLEXERIL) 10 MG tablet Take 1 tablet (10 mg total) by mouth 2 (two) times daily as needed for muscle spasms. (Patient not taking: Reported on 10/10/2019) 10 tablet 0     No Known Allergies  Review of Systems: Pertinent items noted in HPI and remainder of comprehensive ROS otherwise negative.  Physical Exam: BP 119/74   Pulse (!) 110   Temp 98.3 F (36.8 C) (Oral)   Resp 18   Ht _0  (1.6 m)   Wt 67.1 kg   LMP 01/29/2019   BMI 26.20 kg/m  FHR by Doppler: 145 bpm CONSTITUTIONAL: Well-developed, well-nourished female in no acute distress.  HENT:  Normocephalic, atraumatic, External right and left ear normal. Oropharynx is clear and moist EYES: Conjunctivae and EOM are normal. No scleral icterus.  NECK: Normal range of motion, supple, no masses SKIN: Skin is warm and dry. No rash noted. Not diaphoretic. No erythema. No pallor. [place]: Alert and oriented to person, place, and time. Normal reflexes, muscle tone coordination. No cranial nerve deficit noted. PSYCHIATRIC: Normal mood and affect. Normal behavior. Normal judgment and thought content. CARDIOVASCULAR: Normal heart rate noted RESPIRATORY: Effort and breath sounds normal, no problems with respiration noted ABDOMEN: Soft, nontender, nondistended, gravid. Well-healed Pfannenstiel incision. PELVIC: Deferred MUSCULOSKELETAL: Normal range of motion. No edema and no tenderness. 2+ distal pulses.   Pertinent Labs/Studies:   Results for orders placed or performed during the hospital encounter of 10/27/19 (from the past 72 hour(s))  SARS CORONAVIRUS 2  (TAT 6-24 HRS) Nasopharyngeal Nasopharyngeal Swab     Status: None   Collection Time: 10/27/19  8:35 AM   Specimen: Nasopharyngeal Swab  Result Value Ref Range   SARS Coronavirus 2 NEGATIVE NEGATIVE    Comment: (NOTE) SARS-CoV-2 target nucleic acids are NOT DETECTED. The SARS-CoV-2 RNA is generally detectable in upper and lower respiratory specimens during the acute phase of infection. Negative results do not preclude SARS-CoV-2 infection, do not rule out co-infections with other pathogens, and should not be used as the sole basis for treatment or other patient management decisions. Negative results must be combined with clinical observations, patient history, and epidemiological information. The expected result is Negative. Fact Sheet for  Patients: SugarRoll.be Fact Sheet for Healthcare Providers: https://www.woods-mathews.com/ This test is not yet approved or cleared by the [place] FDA and  has been authorized for detection and/or diagnosis of SARS-CoV-2 by FDA under an Emergency Use Authorization (EUA). This EUA will remain  in effect (meaning this test can be used) for the duration of the COVID-19 declaration under Section 56 4(b)(1) of the Act, 21 U.S.C. section 360bbb-3(b)(1), unless the authorization is terminated or revoked sooner. Performed at [facility], [place] [address] [place], [place] [address]   CBC     Status: Abnormal   Collection Time: 10/27/19  8:39 AM  Result Value Ref Range   WBC 12.3 (H) 4.0 - 10.5 K/uL   RBC 3.53 (L) 3.87 - 5.11 MIL/uL   Hemoglobin 9.6 (L) 12.0 - 15.0 g/dL   HCT 29.8 (L) 36.0 - 46.0 %   MCV 84.4 80.0 - 100.0 fL   MCH 27.2 26.0 - 34.0 pg   MCHC 32.2 30.0 - 36.0 g/dL   RDW 14.6 11.5 - 15.5 %   Platelets 358 150 - 400 K/uL   nRBC 0.0 0.0 - 0.2 %    Comment: Performed at [facility], [place] [address] [place], [place] [address]  RPR     Status: None   Collection Time: 10/27/19   8:39 AM  Result Value Ref Range   RPR Ser Ql NON REACTIVE NON REACTIVE    Comment: Performed at [facility], [place] [address] [place], [place] [address]  Type and screen [place]     Status: None   Collection Time: 10/27/19  8:39 AM  Result Value Ref Range   ABO/RH(D) O POS    Antibody Screen NEG    Sample Expiration      10/30/2019,2359 Performed at [facility], [place] [address], [place], [place] [address]     Assessment and Plan: Cynthia Hull is a 28 y.o. G3P2001 at 45w0dbeing admitted for repeat scheduled cesarean section.  The risks of cesarean section were discussed with the patient including but were not limited to: bleeding which may require transfusion or reoperation; infection which may require antibiotics; injury to bowel, bladder, ureters or other surrounding organs; injury to the fetus; need for additional procedures including hysterectomy in the event of a life-threatening hemorrhage; placental abnormalities wth subsequent pregnancies, incisional problems, thromboembolic phenomenon and other postoperative/anesthesia complications.  Patient also desires permanent sterilization.  Other reversible forms of contraception were discussed with patient; she declines all other modalities. Risks of procedure discussed with patient including but not limited to: risk of regret, permanence of method, bleeding, infection, injury to surrounding organs and need for additional procedures.  Failure risk of about 1% with increased risk of ectopic gestation if pregnancy occurs was also discussed with patient.  Also discussed possibility of post-tubal pain syndrome. The patient concurred with the proposed plan, giving informed written consent for the procedures.  Patient has been NPO since midnight she will remain NPO for procedure. Anesthesia and OR aware.  Preoperative prophylactic antibiotics and SCDs ordered on call to the OR.  To OR when ready.  Pregnancy Complications:  None Contraception: BTL  CBarrington Ellison MD OB Family Medicine Fellow, [facility] [place]

## 2019-10-29 NOTE — Anesthesia Preprocedure Evaluation (Signed)
Anesthesia Evaluation  Patient identified by MRN, date of birth, ID band Patient awake    Reviewed: Allergy & Precautions, NPO status , Patient's Chart, lab work & pertinent test results  Airway Mallampati: II  TM Distance: >3 FB Neck ROM: Full    Dental no notable dental hx.    Pulmonary neg pulmonary ROS, Patient abstained from smoking., former smoker,    Pulmonary exam normal breath sounds clear to auscultation       Cardiovascular negative cardio ROS Normal cardiovascular exam Rhythm:Regular Rate:Normal     Neuro/Psych  Headaches, negative psych ROS   GI/Hepatic negative GI ROS, Neg liver ROS,   Endo/Other  negative endocrine ROS  Renal/GU negative Renal ROS  negative genitourinary   Musculoskeletal negative musculoskeletal ROS (+)   Abdominal   Peds  Hematology  (+) Blood dyscrasia (Hgb 9.6), anemia ,   Anesthesia Other Findings   Reproductive/Obstetrics (+) Pregnancy                             Anesthesia Physical Anesthesia Plan  ASA: II  Anesthesia Plan: Spinal   Post-op Pain Management:    Induction:   PONV Risk Score and Plan: Treatment may vary due to age or medical condition  Airway Management Planned: Natural Airway  Additional Equipment:   Intra-op Plan:   Post-operative Plan:   Informed Consent: I have reviewed the patients History and Physical, chart, labs and discussed the procedure including the risks, benefits and alternatives for the proposed anesthesia with the patient or authorized representative who has indicated his/her understanding and acceptance.     Dental advisory given  Plan Discussed with: CRNA  Anesthesia Plan Comments:         Anesthesia Quick Evaluation

## 2019-10-29 NOTE — Transfer of Care (Signed)
Immediate Anesthesia Transfer of Care Note  Patient: Cynthia Hull  Procedure(s) Performed: CESAREAN SECTION (N/A Abdomen)  Patient Location: PACU  Anesthesia Type:Spinal  Level of Consciousness: awake  Airway & Oxygen Therapy: Patient Spontanous Breathing  Post-op Assessment: Report given to RN  Post vital signs: Reviewed and stable  Last Vitals:  Vitals Value Taken Time  BP    Temp    Pulse    Resp 17 10/29/19 1056  SpO2    Vitals shown include unvalidated device data.  Last Pain:  Vitals:   10/29/19 0915  TempSrc:   PainSc: 0-No pain         Complications: No apparent anesthesia complications

## 2019-10-29 NOTE — Op Note (Signed)
10/29/2019  10:36 AM  PATIENT:  Cynthia Hull  28 y.o. female  PRE-OPERATIVE DIAGNOSIS:  Previous cesarean section, declines trial of labor, unwanted fertility  POST-OPERATIVE DIAGNOSIS:  same  PROCEDURE:  Procedure(s): CESAREAN SECTION (N/A), repeat and bilateral tubal ligation  SURGEON:  Surgeon(s) and Role:    * Brailee Riede, Wilhemina Cash, MD - Primary    * Fair, Marin Shutter, MD - Assisting  FINDINGS: Living female infant, normal pelvic anatomy, and normal anterior placenta  ANESTHESIA:   local and spinal  EBL: 500 ml  BLOOD ADMINISTERED:none  DRAINS: Urinary Catheter (Foley)   LOCAL MEDICATIONS USED:  MARCAINE     SPECIMEN:  Source of Specimen:  cord blood  DISPOSITION OF SPECIMEN:  PATHOLOGY  COUNTS:  YES  TOURNIQUET:  * No tourniquets in log *  DICTATION: .Dragon Dictation  PLAN OF CARE: Admit to inpatient   PATIENT DISPOSITION:  PACU - hemodynamically stable.   Delay start of Pharmacological VTE agent (>24hrs) due to surgical blood loss or risk of bleeding: not applicable  The risks, benefits, and alternatives of surgery were explained, understood, accepted. Consents were signed. All questions were answered. She understands the permanence of a tubal ligation as well as the 1 in 400 failure rate.  In the operating room spinal anesthesia was applied without complication. Her abdomen and vagina were prepped and draped in the usual sterile fashion. A Foley catheter was placed, draining clear urine throughout case. Timeout procedure was done. After adequate anesthesia was assured 30 mL for 0.5% Marcaine was injected into the subcutaneous tissue at the site of her previous cesarean. An incision was made through the previous incision. The incision was carried down through the subcutaneous tissue to the fascia. The fascia was scored the midline and extended bilaterally. The rectus fascia was elevated off of the rectus muscles superiorly and inferiorly. They were separated in the midline.  Excellent hemostasis was maintained. The peritoneum was entered with hemostats. Peritoneal incision was extended bilaterally with the Bovie. The bladder blade was placed. A transverse incision was made on the well-developed lower uterine segment. The uterine incision was extended with traction on each side. Amniotomy was performed with a hemostat. Clear fluid was noted. The baby was delivered from a vertex presentation.the mouth and nostrils were suctioned prior to delivery of the shoulders.  The baby's cord was clamped and cut and was transferred to the NICU personnel for routine care after 1 minute. The placenta was delivered intact with traction. The uterus was left in situ and the interior was cleaned with a dry lap sponge. The uterine incision was closed with 2-0 Vicryl running locking suture. Excellent hemostasis was noted. By tilting the uterus each side was able to visualize the adnexa, and they were normal. I placed a Filsche clip over each entire tube in the isthmic region. The rectus fascia rectus muscles were noted be hemostatic as well. The fascia was closed with a #1 PDS loop in a running nonlocking fashion. No defects were palpable. The subcutaneous tissue was irrigated, clean, and dried. A subcuticular closure was done with a 3-0 Vicryl suture. Excellent cosmetic results were obtained. She was taken to the recovery room in stable condition. She tolerated the procedure well.

## 2019-10-29 NOTE — Discharge Instructions (Signed)

## 2019-10-29 NOTE — Progress Notes (Signed)
0750 Pt arrived to unit.

## 2019-10-29 NOTE — Anesthesia Procedure Notes (Signed)
Spinal  Patient location during procedure: OR Start time: 10/29/2019 9:47 AM End time: 10/29/2019 9:57 AM Staffing Anesthesiologist: Freddrick March, MD Performed: anesthesiologist  Preanesthetic Checklist Completed: patient identified, surgical consent, pre-op evaluation, timeout performed, IV checked, risks and benefits discussed and monitors and equipment checked Spinal Block Patient position: sitting Prep: site prepped and draped and DuraPrep Patient monitoring: cardiac monitor, continuous pulse ox and blood pressure Approach: midline Location: L3-4 Injection technique: single-shot Needle Needle type: Pencan  Needle gauge: 24 G Needle length: 9 cm Assessment Sensory level: T6 Additional Notes Functioning IV was confirmed and monitors were applied. Sterile prep and drape, including hand hygiene and sterile gloves were used. The patient was positioned and the spine was prepped. The skin was anesthetized with lidocaine.  Free flow of clear CSF was obtained prior to injecting local anesthetic into the CSF.  The spinal needle aspirated freely following injection.  The needle was carefully withdrawn.  The patient tolerated the procedure well.

## 2019-10-30 LAB — CBC
HCT: 23 % — ABNORMAL LOW (ref 36.0–46.0)
Hemoglobin: 7.4 g/dL — ABNORMAL LOW (ref 12.0–15.0)
MCH: 27.2 pg (ref 26.0–34.0)
MCHC: 32.2 g/dL (ref 30.0–36.0)
MCV: 84.6 fL (ref 80.0–100.0)
Platelets: 261 10*3/uL (ref 150–400)
RBC: 2.72 MIL/uL — ABNORMAL LOW (ref 3.87–5.11)
RDW: 14.6 % (ref 11.5–15.5)
WBC: 21.9 10*3/uL — ABNORMAL HIGH (ref 4.0–10.5)
nRBC: 0 % (ref 0.0–0.2)

## 2019-10-30 NOTE — Lactation Note (Signed)
This note was copied from a baby's chart. Lactation Consultation Note  Patient Name: Cynthia Hull [id number] Date: 10/30/2019  P2, 40 hour female infant. Mom is now documenting infant feeding, stools and voids in her phone.  Per mom, breastfeeding is going well. Infant been breastfeeding for 10 minutes most feedings, LC discussed ways to keep infant stimulate to nurse longer at breast, doing : STS, burping infant, rubbing infant neck and shoulder. Mom was appreciative of information.  Mom has been pumping after latching infant to breast (her choice) and LC noticed pumped breast milk on table, LC discussed milk storage guidelines how long breast milk is safe at room temperature with mom. Mom will breastfed first and then offer infant pumped EBM after feeding this is her choice. Mom knows to call Nurse or [place] if she has any questions, concerns or need assistance  with latching infant to breast.    Maternal Data    Feeding Feeding Type: Breast Fed Nipple Type: Slow - flow  LATCH Score Latch: Grasps breast easily, tongue down, lips flanged, rhythmical sucking.  Audible Swallowing: A few with stimulation  Type of Nipple: Everted at rest and after stimulation  Comfort (Breast/Nipple): Soft / non-tender  Hold (Positioning): No assistance needed to correctly position infant at breast.  LATCH Score: 9  Interventions    Lactation Tools Discussed/Used     Consult Status      Vicente Serene 10/30/2019, 11:42 PM

## 2019-10-30 NOTE — Lactation Note (Signed)
This note was copied from a baby's chart. Lactation Consultation Note  Patient Name: Cynthia Hull Date: 10/30/2019  P2, 79 hour female infant. LC entered room mom and infant asleep at this time.    Maternal Data    Feeding Feeding Type: Breast Fed Nipple Type: Slow - flow  LATCH Score Latch: Grasps breast easily, tongue down, lips flanged, rhythmical sucking.  Audible Swallowing: A few with stimulation  Type of Nipple: Everted at rest and after stimulation  Comfort (Breast/Nipple): Soft / non-tender  Hold (Positioning): No assistance needed to correctly position infant at breast.  LATCH Score: 9  Interventions    Lactation Tools Discussed/Used     Consult Status      Vicente Serene 10/30/2019, 10:53 PM

## 2019-10-30 NOTE — Progress Notes (Addendum)
Subjective: Postpartum Day 2: Cesarean Delivery Patient reports pain at incision site but otherwise doing well. Voiding, ambulating, and passing flatus. No bowel movements at this time. Tolerating a regular diet. Breast and bottle feeding. No other complaints.    Objective: Vital signs in last 24 hours: Temp:  [97.8 F (36.6 C)-98 F (36.7 C)] 98 F (36.7 C) (11/03 0500) Pulse Rate:  [62-69] 69 (11/03 0500) Resp:  [18] 18 (11/02 1100) BP: (103-115)/(50-63) 105/50 (11/03 0500) SpO2:  [100 %] 100 % (11/03 0500)  Physical Exam:  General: alert, cooperative and appears stated age 28: appropriate Uterine Fundus: firm Incision: healing well, no significant drainage, no dehiscence, no significant erythema DVT Evaluation: No evidence of DVT seen on physical exam.  Recent Labs    10/30/19 1224  HGB 7.4*  HCT 23.0*    Assessment/Plan: POD 2: Status post Cesarean section. Doing well postoperatively. Continue current care. Anemia: Hgb 9.6 > 7.4 patient remains asymptomatic. S/p Ferriheme.  Breastfeeding; lactation assistance as needed S/p BTL Likely discharge today.   Maxie Better MD, PGY-1  OBGYN Faculty Teaching service   I saw and evaluated the patient. I agree with the findings and the plan of care as documented in the resident's note. DC today. Will prescribe Oxy. Received Feraheme this morning. Feeling well and would like to DC.  Barrington Ellison, MD [facility] Family Medicine Fellow, [facility] for [employer], [place]

## 2019-10-30 NOTE — Clinical Social Work Maternal (Signed)
CLINICAL SOCIAL WORK MATERNAL/CHILD NOTE  Patient Details  Name: Cynthia Hull MRN: [id number] Date of Birth: Jan 15, 1991  Date:  10/30/2019  Clinical Social Worker Initiating Note:  Elijio Miles Date/Time: Initiated:  10/30/19/1100     Child's Name:  Cynthia Hull   Biological Parents:  Mother, Father(Cynthia Hull and Cynthia Hull DOB: 05/15/1991)   Need for Interpreter:  None   Reason for Referral:  Current Substance Use/Substance Use During Pregnancy , Grief and Loss (newborn loss in 2017)   Address:  [place] [address]    Phone number:  [phone] (home)     Additional phone number:   Household Members/Support Persons (HM/SP):   Household Member/Support Person 1, Household Member/Support Person 2   HM/SP Name Relationship DOB or Age  HM/SP -1 Cynthia Hull FOB 05/15/1991  HM/SP -2 Anette Guarneri Son 10/22/2017  HM/SP -3        HM/SP -4        HM/SP -5        HM/SP -6        HM/SP -7        HM/SP -8          Natural Supports (not living in the home):  Parent, Immediate Family   Professional Supports: None   Employment: Full-time   Type of Work: Engineer, manufacturing   Education:  [place] arranged:    Museum/gallery curator Resources:  [employer]   Other Resources:  Physicist, medical , [place] Considerations Which May Impact Care:    Strengths:  Ability to meet basic needs , Home prepared for child , Pediatrician chosen   Psychotropic Medications:         Pediatrician:    [facility] area([facility])  Pediatrician List:   [place]      Pediatrician Fax Number:    Risk Factors/Current Problems:  Substance Use    Cognitive State:  Alert , Able to Concentrate , Linear Thinking    Mood/Affect:  Bright , Calm , Comfortable , Interested , Happy , Relaxed    CSW  Assessment:  CSW received consult for THC use and history of newborn loss.  CSW met with [name] to offer support and complete assessment.    [name] sitting up in bed finishing breakfast with FOB at bedside and infant asleep in bassinet, when CSW entered the room. CSW introduced self and with [name]'s permission asked that FOB step out of the room so CSW could complete assessment with [name] in private. FOB understanding and left voluntarily. [name] very pleasant and engaged throughout assessment. Per [name], she and FOB currently live together with her 56-year-old son. [name] shared she has joint custody of her son with his father, [name], so she has him 50 percent of the time. [name] stated she currently works full-time at [employer] and is now out on maternity leave. [name] reported she receives both [facility] and food stamps and is aware she needs to follow up with them to get infant added to her plans. CSW inquired about [name]'s mental health history and [name] denied having any but did share with CSW that she had a traumatic thing happen to her. [name] went on to share that she lost her first child in 2017 following delivery due to a brain hemorrhage. [name] became emotional  at this and CSW allowed [name] to share her story. CSW offered her condolences and inquired about if [name] has been able to work through her loss. [name] stated she has and shared with CSW that they celebrate his birthday every year. [name] acknowledged grieving process that happened following that delivery. [name] denied any PPD with her second child and reported she was just "too happy that she made it". CSW provided education regarding the baby blues period vs. perinatal mood disorders, discussed treatment and gave resources for mental health follow up if concerns arise.  CSW recommends self-evaluation during the postpartum time period using the New Mom Checklist from Postpartum Progress and encouraged [name] to contact a medical professional if symptoms are noted at any  time. [name] did not appear to be displaying any acute mental health symptoms and denied any current SI, HI or DV. [name] reported feeling well-supported by FOB, her mother and her siblings.  CSW inquired about [name]'s substance use history and [name] acknowledged using marijuana in March before she knew she was pregnant. [name] denied any substance use after finding out she was pregnant. CSW informed [name] of [place] and explained CDS was still pending and a CPS report would be made, if warranted. CSW aware infant's UDS unable to be collected. CSW inquired about previous CPS involvement and [name] reported CPS was involved after her son was born due to positive drug screen for marijuana. [name] reported case was closed and denied any CPS involvement since.   [name] confirmed having all essential items for infant once discharge and reported infant would be sleeping in a bassinet once home. CSW started to provide review of Sudden Infant Death Syndrome (SIDS) precautions and safe sleeping habits, when [name] requested that CSW do this while FOB was present as he is a first time dad. CSW asked FOB to return to room and provided education to both [name] and FOB, to which both were receptive.   [name] and FOB denied any further questions, concerns or need for resources from CSW at this time.    CSW Plan/Description:  No Further Intervention Required/No Barriers to Discharge, Sudden Infant Death Syndrome (SIDS) Education, Perinatal Mood and Anxiety Disorder (PMADs) Education, [place], CSW Will Continue to Monitor Umbilical Cord Tissue Drug Screen Results and Make Report if Foye Spurling, [place] 10/30/2019, 11:55 AM

## 2019-10-30 NOTE — Progress Notes (Signed)
Pt reports pain in abdomen 7/10 at this time. Pt states that abdomen "feels hard". Abdomen feels slightly distended but soft. Bowel sounds audible in all four quadrants and pt reports passing gas, last BM 10/31 . Fundus firm U/1 with scant bleeding. Encouraged patient to ambulate in room.  Pt also refuses post delivery CBC x 2. Message left for Dr. Darene Lamer to call back for update.

## 2019-10-30 NOTE — Lactation Note (Signed)
This note was copied from a baby's chart. Lactation Consultation Note Baby 68 hrs old.  Mom states BF going OK, mom is BF/Formula and has given a bottle all ready. Encouraged to BF before giving formula. Mom has 27 yr old that she pumped and bottle fed 1 yr. Mom has "V" shaped breast w/everted nipples at the bottom of the breast. Suggested football hold. Worked w/mom hand positioning, support, hand expression, breast massage, and obtaining deep latch. Taught lip and chin flange/tug. Baby was popping off and on frequently. Explained normal. Mom stated she pumped and collected 5 ml and gave it to the baby. Praised mom. Heard swallows during feeding.  Newborn behavior, feeding habits, body alignment, STS, I&O, supply and demand discussed. Mom encouraged to feed baby 8-12 times/24 hours and with feeding cues.  Encouraged to call for assistance or questions.  Lactation brochure given.  Patient Name: Cynthia Hull [id number] Date: 10/30/2019 Reason for consult: Initial assessment;Term   Maternal Data Has patient been taught Hand Expression?: Yes Does the patient have breastfeeding experience prior to this delivery?: Yes  Feeding Feeding Type: Breast Fed  LATCH Score Latch: Repeated attempts needed to sustain latch, nipple held in mouth throughout feeding, stimulation needed to elicit sucking reflex.  Audible Swallowing: Spontaneous and intermittent  Type of Nipple: Everted at rest and after stimulation  Comfort (Breast/Nipple): Soft / non-tender  Hold (Positioning): Assistance needed to correctly position infant at breast and maintain latch.  LATCH Score: 8  Interventions Interventions: Breast feeding basics reviewed;Adjust position;Assisted with latch;Support pillows;Skin to skin;Position options;Breast massage;Hand express;Breast compression  Lactation Tools Discussed/Used Tools: Pump Breast pump type: Double-Electric Breast Pump WIC Program: Yes Pump Review: Milk  Storage Initiated by:: RN Date initiated:: 10/29/19   Consult Status Consult Status: Follow-up Date: 10/30/19(in pm) Follow-up type: In-patient    Theodoro Kalata 10/30/2019, 2:58 AM

## 2019-10-31 ENCOUNTER — Telehealth: Payer: Medicaid Other | Admitting: Advanced Practice Midwife

## 2019-10-31 MED ORDER — SENNOSIDES-DOCUSATE SODIUM 8.6-50 MG PO TABS: 2.0000 | ORAL_TABLET | ORAL | 0 refills | Status: AC

## 2019-10-31 MED ORDER — POLYETHYLENE GLYCOL 3350 17 G PO PACK: 17.0000 g | PACK | Freq: Every day | ORAL | 0 refills | Status: AC

## 2019-10-31 MED ORDER — SERTRALINE HCL 25 MG PO TABS: 25.0000 mg | ORAL_TABLET | Freq: Every day | ORAL | 2 refills | Status: AC

## 2019-10-31 MED ORDER — SODIUM CHLORIDE 0.9 % IV SOLN
510.0000 mg | Freq: Once | INTRAVENOUS | Status: AC
  Administered 2019-10-31: 11:00:00 510 mg via INTRAVENOUS
  Filled 2019-10-31 (×2): qty 17

## 2019-10-31 MED ORDER — FERROUS SULFATE 325 (65 FE) MG PO TABS: 325.0000 mg | ORAL_TABLET | Freq: Every day | ORAL | 1 refills | Status: AC

## 2019-10-31 NOTE — Progress Notes (Signed)
CSW spoke with NP who reported [name] became emotional during NP visit and when discussing discharge. CSW spoke with [name] at bedside regarding how she was feeling. FOB present at bedside but did not acknowledge CSW and was walking around packing up the room. [name] initially reported she was feeling ok but then became tearful and reported concerns of not knowing how to divide her time equally between her children and being worried she'd be doing it all alone. CSW validated and normalized [name]'s concerns and acknowledge adjustment period that comes with going from having one child to two children. CSW also reviewed baby blues period versus PMADs with [name] and encouraged [name] to be patient and kind with herself if she feels emotional these next couple of weeks. CSW offered to make referrals for [name] after discharge to offer further support to [name] and infant. [name] stated she thinks she participated in a program last time and would love to do it again. CSW to make [facility] and [facility] referrals. [name] denied any further questions or concerns for [name], at this time.   Cynthia Hull, [place]  [facility] and [employer] [phone]

## 2019-10-31 NOTE — Lactation Note (Signed)
This note was copied from a baby's chart. Lactation Consultation Note  Patient Name: Cynthia Hull [id number] Date: 10/31/2019   LC in to visit with P3 Mom of term baby at 60 hrs old.  Mom has decided she would like to be discharged today.  Baby at 6% weight loss.   Mom is choosing to breast and formula feed baby.  She recently pumped 10 ml which she fed to baby.  Offered to assist with positioning and latching prior to discharge, but parents are packing up.  Talked about OP lactation follow-up and Mom perked up saying she would like that.  Request sent to clinic.    Encouraged keeping baby STS as much as possible, offering breast with cues.  Mom has a Medela DEBP at home, is taking all the pump parts home with her.  Encouraged Mom to double pump regularly to support a full milk supply.    Mom knows she can call prn with questions.  Mom was very appreciative of all the help she has received.     Broadus John 10/31/2019, 12:13 PM

## 2019-11-05 ENCOUNTER — Inpatient Hospital Stay (HOSPITAL_COMMUNITY): Payer: Medicaid Other

## 2019-11-05 ENCOUNTER — Other Ambulatory Visit: Payer: Self-pay

## 2019-11-05 ENCOUNTER — Encounter (HOSPITAL_COMMUNITY): Payer: Self-pay

## 2019-11-05 ENCOUNTER — Inpatient Hospital Stay (HOSPITAL_COMMUNITY)
Admission: EM | Admit: 2019-11-05 | Discharge: 2019-11-05 | Disposition: A | Discharge status: Home / Self Care | Payer: Medicaid Other | Attending: Obstetrics & Gynecology | Admitting: Obstetrics & Gynecology

## 2019-11-05 DIAGNOSIS — Z8249 Family history of ischemic heart disease and other diseases of the circulatory system: Secondary | ICD-10-CM | POA: Diagnosis not present

## 2019-11-05 DIAGNOSIS — Z87891 Personal history of nicotine dependence: Secondary | ICD-10-CM | POA: Diagnosis not present

## 2019-11-05 DIAGNOSIS — T8149XA Infection following a procedure, other surgical site, initial encounter: Secondary | ICD-10-CM | POA: Diagnosis not present

## 2019-11-05 DIAGNOSIS — Z98891 History of uterine scar from previous surgery: Secondary | ICD-10-CM

## 2019-11-05 DIAGNOSIS — R109 Unspecified abdominal pain: Secondary | ICD-10-CM | POA: Diagnosis not present

## 2019-11-05 DIAGNOSIS — D649 Anemia, unspecified: Secondary | ICD-10-CM | POA: Diagnosis not present

## 2019-11-05 DIAGNOSIS — Z9889 Other specified postprocedural states: Secondary | ICD-10-CM | POA: Insufficient documentation

## 2019-11-05 DIAGNOSIS — R1900 Intra-abdominal and pelvic swelling, mass and lump, unspecified site: Secondary | ICD-10-CM | POA: Diagnosis not present

## 2019-11-05 DIAGNOSIS — O9081 Anemia of the puerperium: Secondary | ICD-10-CM | POA: Diagnosis present

## 2019-11-05 DIAGNOSIS — Z79899 Other long term (current) drug therapy: Secondary | ICD-10-CM | POA: Diagnosis not present

## 2019-11-05 LAB — CBC WITH DIFFERENTIAL/PLATELET
Abs Immature Granulocytes: 0.05 10*3/uL (ref 0.00–0.07)
Basophils Absolute: 0.1 10*3/uL (ref 0.0–0.1)
Basophils Relative: 1 %
Eosinophils Absolute: 0.2 10*3/uL (ref 0.0–0.5)
Eosinophils Relative: 2 %
HCT: 22.9 % — ABNORMAL LOW (ref 36.0–46.0)
Hemoglobin: 7.4 g/dL — ABNORMAL LOW (ref 12.0–15.0)
Immature Granulocytes: 1 %
Lymphocytes Relative: 27 %
Lymphs Abs: 2.6 10*3/uL (ref 0.7–4.0)
MCH: 27.8 pg (ref 26.0–34.0)
MCHC: 32.3 g/dL (ref 30.0–36.0)
MCV: 86.1 fL (ref 80.0–100.0)
Monocytes Absolute: 0.8 10*3/uL (ref 0.1–1.0)
Monocytes Relative: 8 %
Neutro Abs: 6 10*3/uL (ref 1.7–7.7)
Neutrophils Relative %: 61 %
Platelets: 276 10*3/uL (ref 150–400)
RBC: 2.66 MIL/uL — ABNORMAL LOW (ref 3.87–5.11)
RDW: 18.1 % — ABNORMAL HIGH (ref 11.5–15.5)
WBC: 9.7 10*3/uL (ref 4.0–10.5)
nRBC: 0 % (ref 0.0–0.2)

## 2019-11-05 MED ORDER — AMOXICILLIN-POT CLAVULANATE 875-125 MG PO TABS: 1.0000 | ORAL_TABLET | Freq: Two times a day (BID) | ORAL | 0 refills | Status: AC

## 2019-11-05 MED ORDER — CLINDAMYCIN HCL 300 MG PO CAPS: 600.0000 mg | ORAL_CAPSULE | Freq: Two times a day (BID) | ORAL | 0 refills | Status: AC

## 2019-11-05 MED ORDER — IOHEXOL 300 MG/ML  SOLN
100.0000 mL | Freq: Once | INTRAMUSCULAR | Status: AC | PRN
  Administered 2019-11-05: 20:00:00 100 mL via INTRAVENOUS

## 2019-11-05 MED ORDER — AMOXICILLIN-POT CLAVULANATE 875-125 MG PO TABS
1.0000 | ORAL_TABLET | Freq: Once | ORAL | Status: AC
  Administered 2019-11-05: 22:00:00 1 via ORAL
  Filled 2019-11-05: qty 1

## 2019-11-05 MED ORDER — SODIUM CHLORIDE 0.9 % IV SOLN
510.0000 mg | Freq: Once | INTRAVENOUS | Status: AC
  Administered 2019-11-05: 22:00:00 510 mg via INTRAVENOUS
  Filled 2019-11-05: qty 17

## 2019-11-05 MED ORDER — CLINDAMYCIN HCL 300 MG PO CAPS
600.0000 mg | ORAL_CAPSULE | Freq: Once | ORAL | Status: AC
  Administered 2019-11-05: 22:00:00 600 mg via ORAL
  Filled 2019-11-05: qty 2

## 2019-11-05 MED ORDER — DIPHENHYDRAMINE HCL 25 MG PO CAPS
25.0000 mg | ORAL_CAPSULE | Freq: Once | ORAL | Status: AC
  Administered 2019-11-05: 22:00:00 25 mg via ORAL
  Filled 2019-11-05: qty 1

## 2019-11-05 MED ORDER — DOCUSATE SODIUM 100 MG PO CAPS: 100.0000 mg | ORAL_CAPSULE | Freq: Two times a day (BID) | ORAL | 2 refills | Status: AC

## 2019-11-05 MED ORDER — SODIUM CHLORIDE 0.9 % IV SOLN
INTRAVENOUS | Status: DC | PRN
  Administered 2019-11-05: 1000 mL via INTRAVENOUS

## 2019-11-05 MED ORDER — POLYETHYLENE GLYCOL 3350 17 G PO PACK: 17.0000 g | PACK | Freq: Every day | ORAL | 0 refills | Status: AC

## 2019-11-05 NOTE — Discharge Instructions (Signed)
Hematoma A hematoma is a collection of blood. A hematoma can happen:  Under the skin.  In an organ.  In a body space.  In a joint space.  In other tissues. The blood can thicken (clot) to form a lump that you can see and feel. The lump is often hard and may become sore and tender. The lump can be very small or very big. Most hematomas get better in a few days to weeks. However, some hematomas may be serious and need medical care. What are the causes? This condition is caused by:  An injury.  Blood that leaks under the skin.  Problems from surgeries.  Medical conditions that cause bleeding or bruising. What increases the risk? You are more likely to develop this condition if:  You are an older adult.  You use medicines that thin your blood. What are the signs or symptoms? Symptoms depend on where the hematoma is in your body.  If the hematoma is under the skin, there is: ? A firm lump on the body. ? Pain and tenderness in the area. ? Bruising. The skin above the lump may be blue, dark blue, purple-red, or yellowish.  If the hematoma is deep in the tissues or body spaces, there may be: ? Blood in the stomach. This may cause pain in the belly (abdomen), weakness, passing out (fainting), and shortness of breath. ? Blood in the head. This may cause a headache, weakness, trouble speaking or understanding speech, or passing out. How is this diagnosed? This condition is diagnosed based on:  Your medical history.  A physical exam.  Imaging tests, such as ultrasound or CT scan.  Blood tests. How is this treated? Treatment depends on the cause, size, and location of the hematoma. Treatment may include:  Doing nothing. Many hematomas go away on their own without treatment.  Surgery or close monitoring. This may be needed for large hematomas or hematomas that affect the body's organs.  Medicines. These may be given if a medical condition caused the hematoma. Follow these  instructions at home: Managing pain, stiffness, and swelling   If told, put ice on the area. ? Put ice in a plastic bag. ? Place a towel between your skin and the bag. ? Leave the ice on for 20 minutes, 2-3 times a day for the first two days.  If told, put heat on the affected area after putting ice on the area for two days. Use the heat source that your doctor tells you to use. This could be a moist heat pack or a heating pad. To do this: ? Place a towel between your skin and the heat source. ? Leave the heat on for 20-30 minutes. ? Remove the heat if your skin turns bright red. This is very important if you are unable to feel pain, heat, or cold. You may have a greater risk of getting burned.  Raise (elevate) the affected area above the level of your heart while you are sitting or lying down.  Wrap the affected area with an elastic bandage, if told by your doctor. Do not wrap the bandage too tightly.  If your hematoma is on a leg or foot and is painful, your doctor may give you crutches. Use them as told by your doctor. General instructions  Take over-the-counter and prescription medicines only as told by your doctor.  Keep all follow-up visits as told by your doctor. This is important. Contact a doctor if:  You have a fever.    The swelling or bruising gets worse.  You start to get more hematomas. Get help right away if:  Your pain gets worse.  Your pain is not getting better with medicine.  Your skin over the hematoma breaks or starts to bleed.  Your hematoma is in your chest or belly and you: ? Pass out. ? Feel weak. ? Become short of breath.  You have a hematoma on your scalp that is caused by a fall or injury, and you: ? Have a headache that gets worse. ? Have trouble speaking or understanding speech. ? Become less alert or you pass out. Summary  A hematoma is a collection of blood in any part of your body.  Most hematomas get better on their own in a few days  to weeks. Some may need medical care.  Follow instructions from your doctor about how to care for your hematoma.  Contact a doctor if the swelling or bruising gets worse, or if you are short of breath. This information is not intended to replace advice given to you by your health care provider. Make sure you discuss any questions you have with your health care provider. Document Released: 01/21/2005 Document Revised: 05/19/2018 Document Reviewed: 05/19/2018 Elsevier Patient Education  2020 [employer]  

## 2019-11-05 NOTE — MAU Provider Note (Addendum)
History     CSN: [id number]  Arrival date and time: 11/05/19 1452   First Provider Initiated Contact with Patient 11/05/19 1603      Chief Complaint  Patient presents with  . Abdominal Pain  . Incisional Pain   Cynthia Hull is a 28 y.o. G3P2 who is 7 days post op from a repeat C/S on 11/1. She presents to MAU with complaints of abdominal pain and swelling. She reports symptoms started occurring initially on Thursday. Reports noticing a small portion of her abdomen being swollen that continued to get bigger as her pain worsened. Rates pain 7/10- has been taking ibuprofen and half of a percocet without relief. She denies increase in vaginal bleeding. She denies incision opening or odor at incision.   OB History    Gravida  3   Para  3   Term  3   Preterm  0   AB  0   Living  2     SAB  0   TAB  0   Ectopic  0   Multiple  0   Live Births  3           Past Medical History:  Diagnosis Date  . Anemia   . Headache    as child  . Infection    UTI    Past Surgical History:  Procedure Laterality Date  . CESAREAN SECTION N/A 10/22/2017   Procedure: CESAREAN SECTION;  Surgeon: Aletha Halim, MD;  Location: [facility];  Service: Obstetrics;  Laterality: N/A;  . CESAREAN SECTION N/A 10/29/2019   Procedure: CESAREAN SECTION;  Surgeon: Emily Filbert, MD;  Location: [facility];  Service: Obstetrics;  Laterality: N/A;  . NO PAST SURGERIES      Family History  Problem Relation Age of Onset  . Hypertension Mother   . Healthy Father     Social History   Tobacco Use  . Smoking status: Former Smoker    Years: 1.00    Types: Cigars  . Smokeless tobacco: Never Used  . Tobacco comment: quit with +preg  Substance Use Topics  . Alcohol use: Not Currently  . Drug use: Not Currently    Types: Marijuana    Comment: March 2020    Allergies: No Known Allergies  Medications Prior to Admission  Medication Sig Dispense Refill Last Dose  . acetaminophen  (TYLENOL) 500 MG tablet Take 2 tablets (1,000 mg total) by mouth every 6 (six) hours as needed. 30 tablet 0   . AMBULATORY NON FORMULARY MEDICATION 1 Device by Other route once a week. Blood Pressure Cuff Medium Monitored Regularly at home ICD 10:Z34.90 1 kit 0   . ferrous sulfate (FERROUSUL) 325 (65 FE) MG tablet Take 1 tablet (325 mg total) by mouth daily with breakfast. 90 tablet 1   . ibuprofen (ADVIL) 600 MG tablet Take 1 tablet (600 mg total) by mouth every 6 (six) hours as needed. 30 tablet 1   . oxyCODONE-acetaminophen (PERCOCET/ROXICET) 5-325 MG tablet Take 1 tablet by mouth every 6 (six) hours as needed. 30 tablet 0   . polyethylene glycol (MIRALAX) 17 g packet Take 17 g by mouth daily. 14 each 0   . Prenatal Vit-Fe Fumarate-FA (PRENATAL PO) Take 1 tablet by mouth daily.      Marland Kitchen senna-docusate (SENOKOT-S) 8.6-50 MG tablet Take 2 tablets by mouth daily. 90 tablet 0   . sertraline (ZOLOFT) 25 MG tablet Take 1 tablet (25 mg total) by mouth daily. [place]  tablet 2     Review of Systems  Constitutional: Negative.   Respiratory: Negative.   Cardiovascular: Negative.   Gastrointestinal: Positive for abdominal pain. Negative for constipation, diarrhea, nausea and vomiting.  Musculoskeletal: Negative.   Neurological: Negative.    Physical Exam   Blood pressure 124/80, pulse 75, temperature 98.1 F (36.7 C), temperature source Oral, resp. rate 16, SpO2 99 %, unknown if currently breastfeeding.  Physical Exam  Nursing note and vitals reviewed. Constitutional: She is oriented to person, place, and time. She appears well-developed and well-nourished.  Cardiovascular: Normal rate and regular rhythm.  Respiratory: Effort normal and breath sounds normal.  GI: She exhibits distension. There is abdominal tenderness. There is guarding.    6-7cm fluid collection vs hematoma. Tender to touch. Superior to incision and superficial. Incision clean, dry and intact.   Genitourinary:    Genitourinary  Comments: deferred   Musculoskeletal: Normal range of motion.        General: No edema.  Neurological: She is alert and oriented to person, place, and time.  Psychiatric: She has a normal mood and affect. Her behavior is normal. Thought content normal.   MAU Course  Procedures  MDM Dr Harolyn Rutherford called to bedside to assess, recommends CT scan to determine whether rectus hematoma or subcutaneous abscess.   Patient taken to CT @ 2000 CT scan pending  Care taken over by Dr Darene Lamer at [place], [place] 11/05/19, 8:12 PM   Care assumed from Darrol Poke, CNM  Results for orders placed or performed during the hospital encounter of 11/05/19 (from the past 24 hour(s))  CBC with Differential/Platelet     Status: Abnormal   Collection Time: 11/05/19  9:56 PM  Result Value Ref Range   WBC 9.7 4.0 - 10.5 K/uL   RBC 2.66 (L) 3.87 - 5.11 MIL/uL   Hemoglobin 7.4 (L) 12.0 - 15.0 g/dL   HCT 22.9 (L) 36.0 - 46.0 %   MCV 86.1 80.0 - 100.0 fL   MCH 27.8 26.0 - 34.0 pg   MCHC 32.3 30.0 - 36.0 g/dL   RDW 18.1 (H) 11.5 - 15.5 %   Platelets 276 150 - 400 K/uL   nRBC 0.0 0.0 - 0.2 %   Neutrophils Relative % 61 %   Neutro Abs 6.0 1.7 - 7.7 K/uL   Lymphocytes Relative 27 %   Lymphs Abs 2.6 0.7 - 4.0 K/uL   Monocytes Relative 8 %   Monocytes Absolute 0.8 0.1 - 1.0 K/uL   Eosinophils Relative 2 %   Eosinophils Absolute 0.2 0.0 - 0.5 K/uL   Basophils Relative 1 %   Basophils Absolute 0.1 0.0 - 0.1 K/uL   Immature Granulocytes 1 %   Abs Immature Granulocytes 0.05 0.00 - 0.07 K/uL   Ct Abdomen Pelvis W Contrast  Result Date: 11/05/2019 CLINICAL DATA:  Concern for subcutaneous abscess or rectus hematoma. Post Caesarean section 10/29/2019. EXAM: CT ABDOMEN AND PELVIS WITH CONTRAST TECHNIQUE: Multidetector CT imaging of the abdomen and pelvis was performed using the standard protocol following bolus administration of intravenous contrast. CONTRAST:  167m OMNIPAQUE IOHEXOL 300 MG/ML   SOLN COMPARISON:  None. FINDINGS: Lower chest: Lung bases are clear. Normal heart size. No pericardial effusion. Hepatobiliary: Mild periportal edema, correlate with hydration status. Few subcentimeter hypoattenuating foci in the liver are too small to fully characterize on CT imaging but statistically likely benign. (3/12, 13, 22, 33). No gallbladder wall thickening, distention, calcified gallstones, or frank biliary ductal dilatation.  Pancreas: Unremarkable. No pancreatic ductal dilatation or surrounding inflammatory changes. Spleen: Normal in size without focal abnormality. Adrenals/Urinary Tract: Adrenal glands are unremarkable. Kidneys are normal, without renal calculi, focal lesion, or hydronephrosis. Mild bladder wall thickening. Stomach/Bowel: Distal esophagus, stomach and duodenal sweep are unremarkable. No small bowel wall thickening or dilatation. No evidence of obstruction. Appendix not well visualized. Large volume of fecal material throughout the colon. No colonic dilatation or wall thickening. Vascular/Lymphatic: The aorta is normal caliber. Few reactive appearing groin lymph nodes are visualized. Difficult visualization of the remaining lymphatic chains due to paucity of intraperitoneal fat and abundant bowel material. Reproductive: There is marked heterogeneous thickening of the uterine myometrium with a hypoattenuating linear defect along the anterior lower uterine segment compatible with site of recent Caesarean section. Small hypoattenuating collection is seen along the anterior margin of the incision site measuring 3.3 x 1.4 x 2.3 cm. Indentation of the superior margin of the bladder. There is a small volume of low-attenuation material within the endometrium as well as more high attenuation material in the lower uterine segment and extending into the cervix which may reflect hemorrhagic products. Other: There is extensive anterior soft tissue edema and overlying skin thickening at the site of  recent incision. No soft tissue gas or organized collection is seen in the subcutaneous tissues. No bowel containing hernia. Mild body wall edema. Musculoskeletal: Multilevel degenerative changes are present in the imaged portions of the spine. IMPRESSION: 1. Hypoattenuating linear defect along the anterior lower uterine segment compatible with site of recent Caesarean section. Small adjacent collection along the anterior uterine incision is worrisome for a developing hematoma or seroma though sterility is not ascertained on imaging. 2. Small volume of low-attenuation material within the endometrium as well as more high attenuation material in the lower uterine segment and extending into the cervix which may reflect hemorrhagic products. Could consider evaluation with pelvic ultrasound if there is clinical concern for retained products. 3. Heterogeneous thickening of the myometrium, nonspecific and may be normal up to 10 weeks post delivery. 4. Extensive anterior soft tissue edema and overlying skin thickening at the site of recent incision. No soft tissue gas or organized collection is seen in the subcutaneous tissues. Recommend correlation with clinical exam findings to exclude a cellulitis. 5. Large volume of fecal material throughout the colon, consistent with constipation/delayed intestinal transit. These results were called by telephone at the time of interpretation on 11/05/2019 at 9:00 pm to provider Dr Darene Lamer , who verbally acknowledged these results. Electronically Signed   By: Lovena Le M.D.   On: 11/05/2019 21:01   Case d/w Dr. Harolyn Rutherford, can treat outpatient with close follow up. Assessment and Plan  28 yo G3P2 on POD#7 from a repeat CS on 11/1 who presented to MAU for abdominal pain and swelling. -PO ABX for suspected infection coverage (Clinda 600 and Augmentin 875 BID x14 days)) -Likely incisional hematoma -Feraheme given for anemia -Message sent to [facility] office for f/u appt on Tuesday  -Strict return precautions given  Merilyn Baba, DO OB Fellow, [facility] 11/05/2019 11:05 PM

## 2019-11-05 NOTE — MAU Note (Signed)
Patient had C-Section on Sunday and reports pain in her abdomen that feels sore for the past 3 days. Patient reports that it feels like her incision is coming apart and has drainage.

## 2019-11-06 ENCOUNTER — Encounter: Payer: Medicaid Other | Admitting: Family Medicine

## 2019-11-06 ENCOUNTER — Other Ambulatory Visit: Payer: Medicaid Other

## 2019-11-07 ENCOUNTER — Other Ambulatory Visit: Payer: Self-pay

## 2019-11-07 ENCOUNTER — Ambulatory Visit (INDEPENDENT_AMBULATORY_CARE_PROVIDER_SITE_OTHER): Payer: Medicaid Other

## 2019-11-07 VITALS — BP 137/60 | HR 57 | Wt 153.1 lb

## 2019-11-07 DIAGNOSIS — Z5189 Encounter for other specified aftercare: Secondary | ICD-10-CM

## 2019-11-07 NOTE — Progress Notes (Signed)
Pt here today for an incision check s/p c/o hematoma dx on 11/07/19.  Pt has reported to have after taking antibiotics for two days.  Pt reports that she feels better than what she felt two days ago.  Abdomen observed with some bruising and slight redness around navel area and above.  Pt reports tenderness upon palpation.  Notified Maye Hides, CNM who recommends pt continue to take antibiotics as prescribed.  Advised pt to monitor for signs of infection- increased redness, increased pain, and elevated temp to please call the office.  Verified pt's pp visit on 11/27/19.  Pt verbalized understanding with no further questions.   Mel Almond, RN 11/07/19

## 2019-11-08 ENCOUNTER — Encounter: Payer: Medicaid Other | Admitting: Medical

## 2019-11-08 ENCOUNTER — Other Ambulatory Visit: Payer: Medicaid Other

## 2019-11-08 NOTE — Progress Notes (Signed)
Chart reviewed for nurse visit. Agree with plan of care.   [place]arr [place], [place] 11/08/2019 3:21 PM

## 2019-11-10 LAB — CULTURE, BLOOD (ROUTINE X 2)
Culture: NO GROWTH
Culture: NO GROWTH
Special Requests: ADEQUATE

## 2019-11-13 ENCOUNTER — Ambulatory Visit: Payer: Medicaid Other

## 2019-11-15 ENCOUNTER — Ambulatory Visit: Payer: Self-pay

## 2019-11-15 NOTE — Lactation Note (Addendum)
This note was copied from a baby's chart. Lactation Consultation Note  Patient Name: Cynthia Hull Today's Date: 11/15/2019     11/15/2019  Name: Cynthia Hull MRN: [id number] Date of Birth: 10/29/2018 Gestational Age: Gestational Age: [redacted]w[redacted]d Birth Weight: 102.7 oz Weight today:  Weight: 7 lb 1.7 oz (3315 g)  39 week old term infant presents today with mom for feeding assessment.   Infant has gained 499 grams in the last 15 days with an average daily weight gain of 33 grams a day.   Infant hasn't latched much since she has been home.   Mom is pumping about every 2-3 hours and slacked off her pumpings and noted a big decrease in her supply. Mom has been pumping about 3 x a day this past weekend and has picked up her pumping. Reviewed supply and demand and importance of emptying breast regularly to maintain supply. Mom wants to feed infant breast milk exclusively.   Mom is using the Nuk nipple and is having some choking some if not paced. No choking or drooling on the bottle per mom.   Infant with thin short labial frenulum with adequate stretch with flangability and on the breast. Infant with posterior lingual frenulum noted with good tongue mobility. Mom with no pain after initial latch, Nipple rounded post feeding. Infant transferred well.   Infant to follow up with Dr. Micael Hampshire tomorrow. Infant to follow up with Lactation as needed.    General Information: Mother's reason for visit: Feeding assessment, concerned about milk supply Consult: Initial Lactation consultant: Noralee Stain RN,IBCLC Breastfeeding experience: pumping and bottle feeding   Maternal medications: Iron, Pre-natal vitamin(ATB for Hematoma)  Breastfeeding History: Frequency of breast feeding: not latching    Supplementation: Supplement method: bottle(Nuk, slow flow nipple)         Breast milk volume: 2-3 Breast milk frequency: every 2-3 hours   Pump type: Medela pump in  style Pump frequency: 3 x a day Pump volume: 10 ounces per pumping  Infant Output Assessment: Voids per 24 hours: 8+ Urine color: Clear yellow Stools per 24 hours: 2-4 Stool color: Yellow  Breast Assessment: Breast: Soft, Compressible Nipple: Erect Pain level: 0(some pain with initial latch went away with feeding) Pain interventions: Bra, Breast pump, Lanolin  Feeding Assessment: Infant oral assessment: Variance Infant oral assessment comment: see note Positioning: Football(right breast, 15 minutes) Latch: 2 - Grasps breast easily, tongue down, lips flanged, rhythmical sucking. Audible swallowing: 2 - Spontaneous and intermittent Type of nipple: 2 - Everted at rest and after stimulation Comfort: 1 - Filling, red/small blisters or bruises, mild/mod discomfort Hold: 1 - Assistance needed to correctly position infant at breast and maintain latch LATCH score: 8 Latch assessment: Deep Lips flanged: Yes Suck assessment: Displays both   Pre-feed weight: 3224 grams Post feed weight: 3272 grams Amount transferred: 48 grams Amount supplemented: 0  Additional Feeding Assessment: Infant oral assessment: Variance Infant oral assessment comment: see note Positioning: Football(left breast, 10 minutes) Latch: 2 - Grasps breast easily, tongue down, lips flanged, rhythmical sucking. Audible swallowing: 2 - Spontaneous and intermittent Type of nipple: 2 - Everted at rest and after stimulation Comfort: 1 - Filling, red/small blisters or bruises, mild/mod discomfort Hold: 2 - No assistance needed to correctly position infant at breast LATCH score: 9 Latch assessment: Deep Lips flanged: Yes Suck assessment: Displays both   Pre-feed weight: 3272 grams Post feed weight: 3310 grams Amount transferred: 38 ml Amount supplemented: 0  Totals: Total amount  transferred: 66 ml Total supplement given: 0 Total amount pumped post feed: did not pump   Plan:  1. Offer infant the breast with  feeding cues 2. Keep infant awake at the breast as needed 3. Feed infant skin to skin 4. Massage/compress breast with feeding cues as needed to keep infant active at the breast 5. When offering the bottle, use a slower flow nipple and feed using the paced bottle feeding  6. Infant needs about 60-80 ml (2-3 ounces) for 8 feedings a day or 480-640 ml (16-21 ounces) in 24 hours. Infant may take more or less depending on how often she feeds. Feed infant until she is satisfied.  7. Pump around 8 times a day for 15-20 minutes with your double electric breast pump. Use your hands free bra with pumping. Ideally you want to pump each time infant is bottle feeding if she is not latching to the breast.  8. Keep up the good work 52. Thank you for allowing me to assist you today 10. Please call with any questions or concerns as needed [phone] 11. Follow up with Lactation in 2 weeks  [place], IBCLC                                                   Donn Pierini 11/15/2019, 1:37 PM

## 2019-11-27 ENCOUNTER — Encounter: Payer: Self-pay | Admitting: Advanced Practice Midwife

## 2019-11-27 ENCOUNTER — Ambulatory Visit (INDEPENDENT_AMBULATORY_CARE_PROVIDER_SITE_OTHER): Payer: Medicaid Other | Admitting: Advanced Practice Midwife

## 2019-11-27 ENCOUNTER — Other Ambulatory Visit: Payer: Self-pay

## 2019-11-27 VITALS — BP 134/88 | HR 59 | Wt 143.7 lb

## 2019-11-27 DIAGNOSIS — Z1389 Encounter for screening for other disorder: Secondary | ICD-10-CM | POA: Diagnosis not present

## 2019-11-27 DIAGNOSIS — Z98891 History of uterine scar from previous surgery: Secondary | ICD-10-CM

## 2019-11-27 NOTE — Progress Notes (Signed)
Return to work at 76 weeks pp  Mallie Snooks, MSN, CNM Certified Nurse Midwife, [employer] for [employer], [place] 11/27/19 2:04 PM

## 2019-11-27 NOTE — Patient Instructions (Signed)
Preventive Care 21-27 Years Old, Female Preventive care refers to visits with your health care provider and lifestyle choices that can promote health and wellness. This includes:  A yearly physical exam. This may also be called an annual well check.  Regular dental visits and eye exams.  Immunizations.  Screening for certain conditions.  Healthy lifestyle choices, such as eating a healthy diet, getting regular exercise, not using drugs or products that contain nicotine and tobacco, and limiting alcohol use. What can I expect for my preventive care visit? Physical exam Your health care provider will check your:  Height and weight. This may be used to calculate body mass index (BMI), which tells if you are at a healthy weight.  Heart rate and blood pressure.  Skin for abnormal spots. Counseling Your health care provider may ask you questions about your:  Alcohol, tobacco, and drug use.  Emotional well-being.  Home and relationship well-being.  Sexual activity.  Eating habits.  Work and work environment.  Method of birth control.  Menstrual cycle.  Pregnancy history. What immunizations do I need?  Influenza (flu) vaccine  This is recommended every year. Tetanus, diphtheria, and pertussis (Tdap) vaccine  You may need a Td booster every 10 years. Varicella (chickenpox) vaccine  You may need this if you have not been vaccinated. Human papillomavirus (HPV) vaccine  If recommended by your health care provider, you may need three doses over 6 months. Measles, mumps, and rubella (MMR) vaccine  You may need at least one dose of MMR. You may also need a second dose. Meningococcal conjugate (MenACWY) vaccine  One dose is recommended if you are age 19-21 years and a first-year college student living in a residence hall, or if you have one of several medical conditions. You may also need additional booster doses. Pneumococcal conjugate (PCV13) vaccine  You may need  this if you have certain conditions and were not previously vaccinated. Pneumococcal polysaccharide (PPSV23) vaccine  You may need one or two doses if you smoke cigarettes or if you have certain conditions. Hepatitis A vaccine  You may need this if you have certain conditions or if you travel or work in places where you may be exposed to hepatitis A. Hepatitis B vaccine  You may need this if you have certain conditions or if you travel or work in places where you may be exposed to hepatitis B. Haemophilus influenzae type b (Hib) vaccine  You may need this if you have certain conditions. You may receive vaccines as individual doses or as more than one vaccine together in one shot (combination vaccines). Talk with your health care provider about the risks and benefits of combination vaccines. What tests do I need?  Blood tests  Lipid and cholesterol levels. These may be checked every 5 years starting at age 20.  Hepatitis C test.  Hepatitis B test. Screening  Diabetes screening. This is done by checking your blood sugar (glucose) after you have not eaten for a while (fasting).  Sexually transmitted disease (STD) testing.  BRCA-related cancer screening. This may be done if you have a family history of breast, ovarian, tubal, or peritoneal cancers.  Pelvic exam and Pap test. This may be done every 3 years starting at age 21. Starting at age 30, this may be done every 5 years if you have a Pap test in combination with an HPV test. Talk with your health care provider about your test results, treatment options, and if necessary, the need for more tests.   Follow these instructions at home: Eating and drinking   Eat a diet that includes fresh fruits and vegetables, whole grains, lean protein, and low-fat dairy.  Take vitamin and mineral supplements as recommended by your health care provider.  Do not drink alcohol if: ? Your health care provider tells you not to drink. ? You are  pregnant, may be pregnant, or are planning to become pregnant.  If you drink alcohol: ? Limit how much you have to 0-1 drink a day. ? Be aware of how much alcohol is in your drink. In the [place] one drink equals one 12 oz bottle of beer (355 mL), one 5 oz glass of wine (148 mL), or one 1 oz glass of hard liquor (44 mL). Lifestyle  Take daily care of your teeth and gums.  Stay active. Exercise for at least 30 minutes on 5 or more days each week.  Do not use any products that contain nicotine or tobacco, such as cigarettes, e-cigarettes, and chewing tobacco. If you need help quitting, ask your health care provider.  If you are sexually active, practice safe sex. Use a condom or other form of birth control (contraception) in order to prevent pregnancy and STIs (sexually transmitted infections). If you plan to become pregnant, see your health care provider for a preconception visit. What's next?  Visit your health care provider once a year for a well check visit.  Ask your health care provider how often you should have your eyes and teeth checked.  Stay up to date on all vaccines. This information is not intended to replace advice given to you by your health care provider. Make sure you discuss any questions you have with your health care provider. Document Released: 02/09/2002 Document Revised: 08/25/2018 Document Reviewed: 08/25/2018 Elsevier Patient Education  2020 Elsevier Inc.  

## 2019-11-27 NOTE — Progress Notes (Signed)
Subjective:     Cynthia Hull is a 28 y.o. female who presents for a postpartum visit. She is 4 weeks 1 day postpartum following a low cervical transverse Cesarean section. I have fully reviewed the prenatal and intrapartum course. The delivery was at 35 gestational weeks. Outcome: repeat cesarean section, low transverse incision. Anesthesia: spinal. Postpartum course has been complicated by hematoma, resolved with antibiotics. Baby's course has been unremarkable. Baby is feeding by both breast and bottle - Gerber Gentle. Bleeding staining only. Bowel function is normal. Bladder function is normal. Patient is sexually active. Contraception method is tubal ligation. Postpartum depression screening: negative.  The following portions of the patient's history were reviewed and updated as appropriate: allergies, current medications, past family history, past medical history, past social history, past surgical history and problem list.  Review of Systems A comprehensive review of systems was negative  Objective:    BP 134/88   Pulse (!) 59   Wt 143 lb 11.2 oz (65.2 kg)   Breastfeeding Yes   BMI 25.46 kg/m   General:  alert, cooperative, appears stated age and mild distress   Breasts:  inspection negative, no nipple discharge or bleeding, no masses or nodularity palpable  Lungs: clear to auscultation bilaterally  Heart:  regular rate and rhythm, S1, S2 normal, no murmur, click, rub or gallop  Abdomen: soft, non-tender; bowel sounds normal; no masses,  no organomegaly incision is well-approximated, no ecchymosis, streaking or drainage.    Pelvic Exam:  not evaluated        Assessment:    Normal postpartum exam. Pap smear not done at today's visit. Patient is intermittently tearful throughout appointment. Advised to take full six weeks to recover. Given CNM letter for husband per patient request.   Patient introduced to [facility], LCSW for future reference as needed for assistance in managing  mood fluctuations.  Plan:   1. Contraception: tubal ligation  2. Follow up in one year or as needed.    Mallie Snooks, MSN, CNM Certified Nurse Midwife, [facility] for [employer], [place] 11/27/19 7:48 PM

## 2019-11-29 NOTE — BH Specialist Note (Signed)
Pt did not arrive to video visit and did not answer the phone ; Left HIPPA-compliant message to call back [name] from [facility] at [phone].  ; left MyChart message for patient.     [place] via Telemedicine Video Visit  11/29/2019 Cynthia Hull [id number]  [name]

## 2019-11-30 ENCOUNTER — Other Ambulatory Visit: Payer: Self-pay

## 2019-11-30 ENCOUNTER — Ambulatory Visit: Payer: Medicaid Other | Admitting: Clinical

## 2019-11-30 DIAGNOSIS — Z91199 Patient's noncompliance with other medical treatment and regimen due to unspecified reason: Secondary | ICD-10-CM

## 2019-11-30 DIAGNOSIS — Z5329 Procedure and treatment not carried out because of patient's decision for other reasons: Secondary | ICD-10-CM

## 2020-03-17 IMAGING — US OBSTETRIC <14 WK US AND TRANSVAGINAL OB US
1 series · 15 of 28 positions shown · non-contrast
Comparison: None.

CLINICAL DATA: Vaginal bleeding

EXAM:
OBSTETRIC <14 WK US AND TRANSVAGINAL OB US
TECHNIQUE: Both transabdominal and transvaginal ultrasound examinations were
performed for complete evaluation of the gestation as well as the
maternal uterus, adnexal regions, and pelvic cul-de-sac.
Transvaginal technique was performed to assess early pregnancy.

[Series 1: obstetric <14 wk us and transvaginal ob us · 15 of 53 slices shown]
[im 1/53]
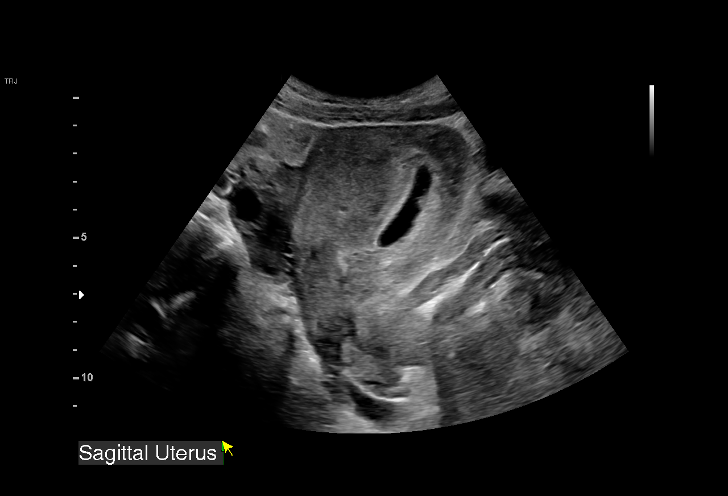
[im 4/53]
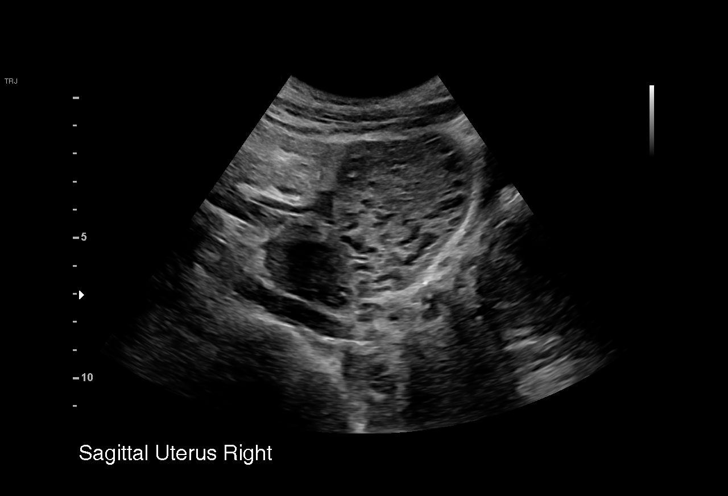
[im 8/53]
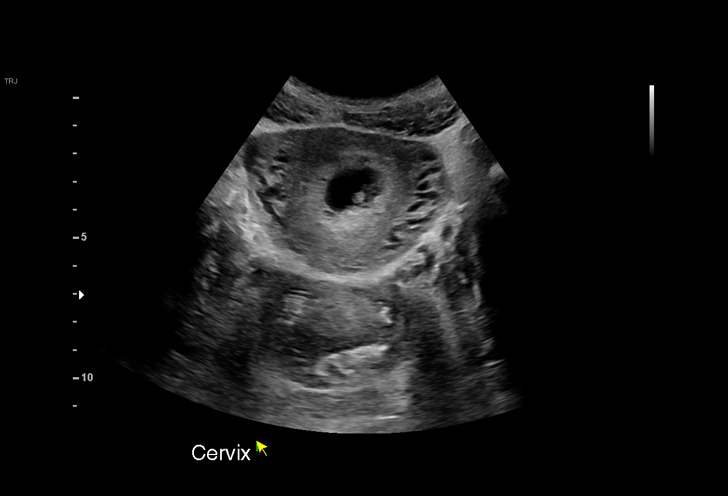
[im 12/53]
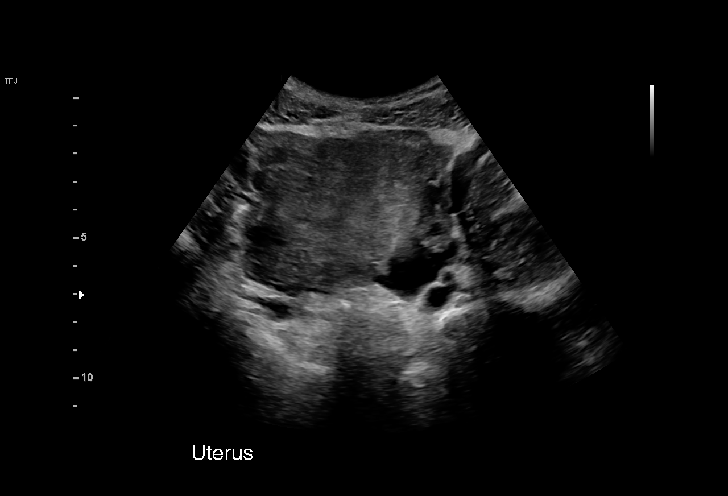
[im 16/53]
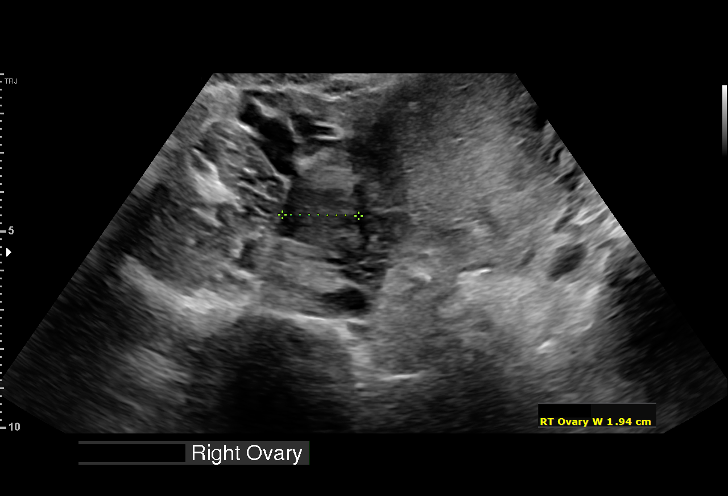
[im 20/53]
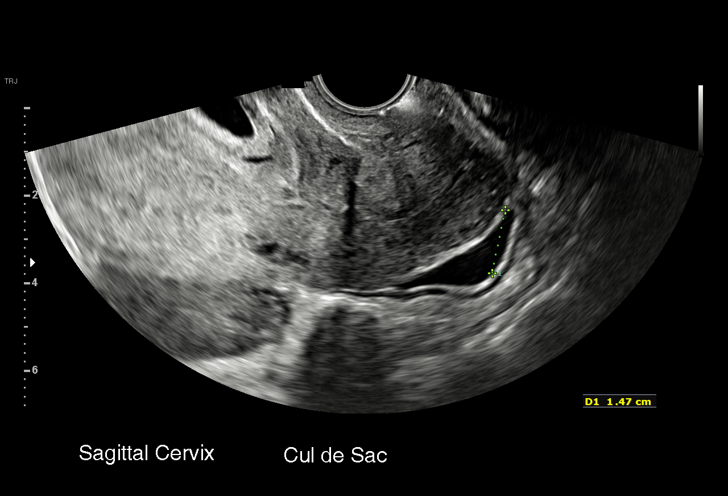
[im 24/53]
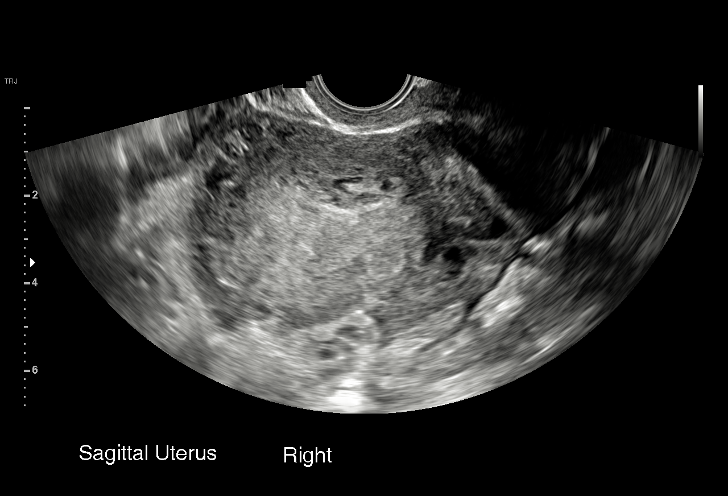
[im 27/53]
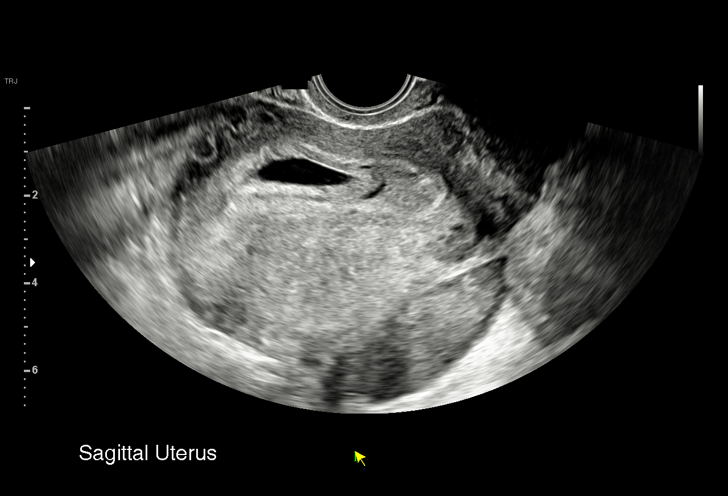
[im 29/53]
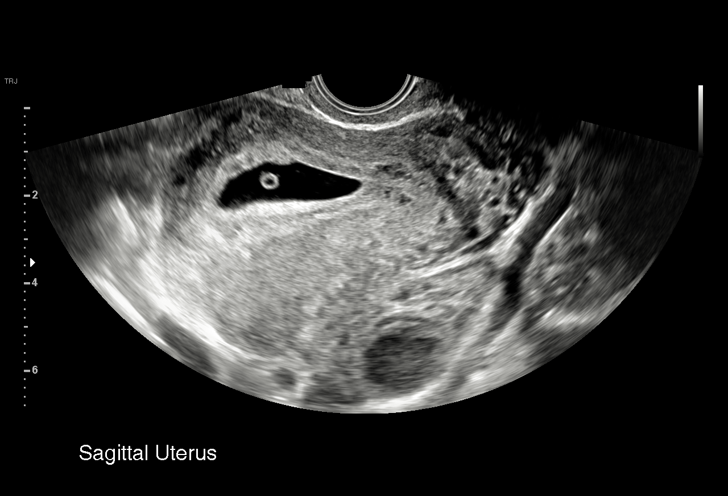
[im 33/53]
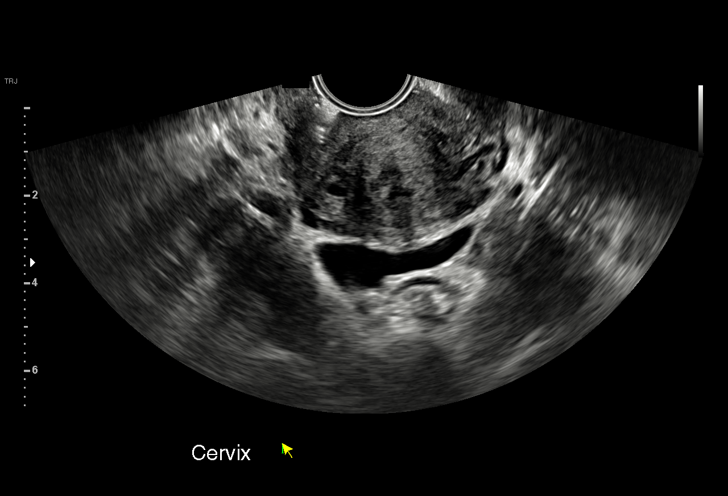
[im 37/53]
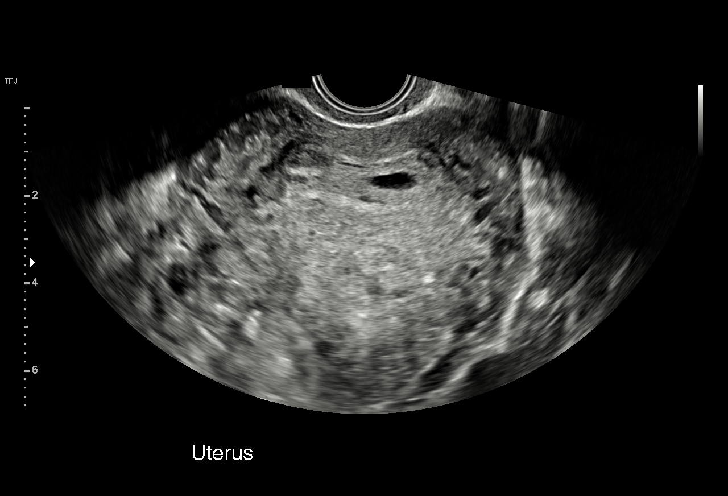
[im 41/53]
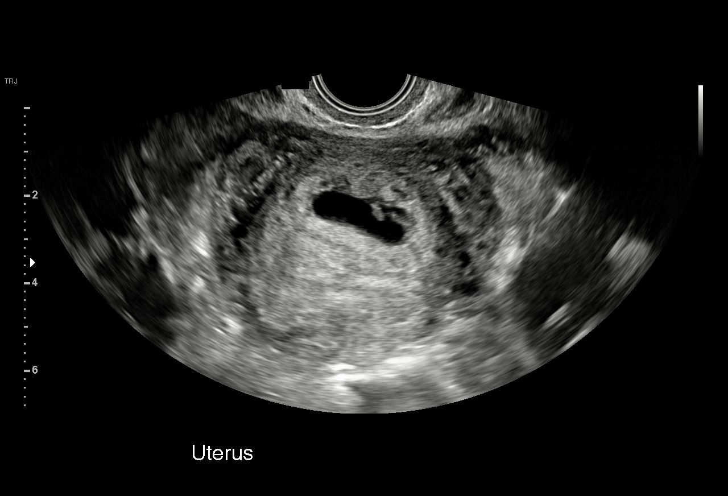
[im 45/53]
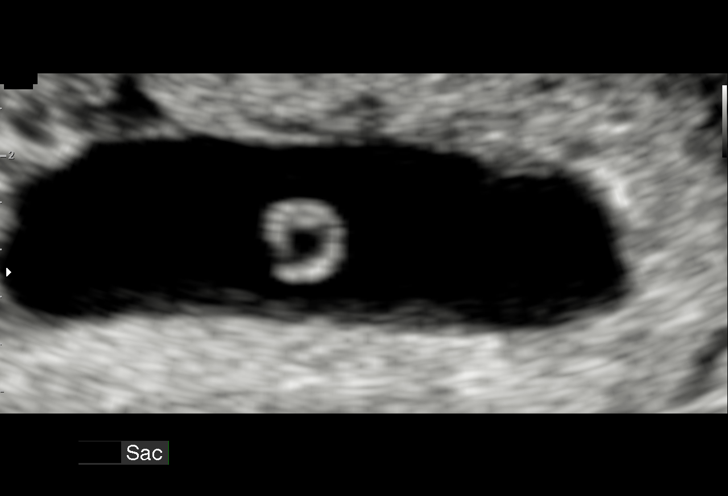
[im 49/53]
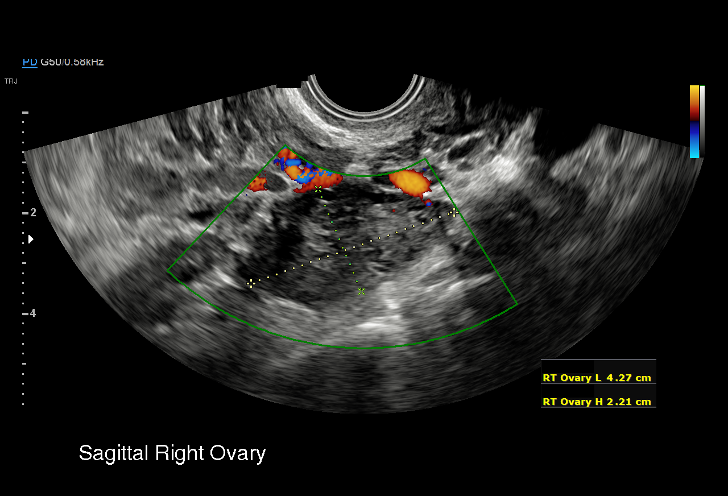
[im 53/53]
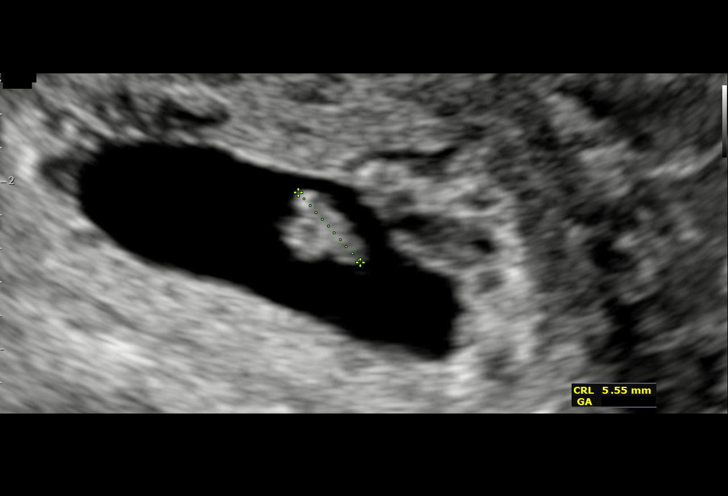

[15 of 28 positions shown; findings below may reference images not displayed]

FINDINGS: Intrauterine gestational sac: Single

Yolk sac:  Visualized.

Embryo:  Visualized.

Cardiac Activity: Visualized.

Heart Rate: 116 bpm

CRL:  5.54 mm   6 w   2 d                  US EDC: 11/04/2019

Subchorionic hemorrhage:  None visualized.

Maternal uterus/adnexae: A small amount of free fluid is noted. A
1.6 cm hypoechoic cyst in the left ovary is most consistent with a
small corpus luteum cyst. The right ovary is unremarkable.
IMPRESSION: Single live intrauterine gestation with an estimated gestational age
of 6 weeks and 2 days. Fetal heart rate is 116 beats per minute. No
evidence of subchorionic hemorrhage.

## 2020-06-14 ENCOUNTER — Ambulatory Visit (HOSPITAL_COMMUNITY): Admit: 2020-06-14 | Discharge status: Absent without leave | Payer: Medicaid Other

## 2020-10-31 IMAGING — US US MFM UA CORD DOPPLER
1 series · 12 of 28 positions shown · non-contrast
Comparison: none

[Series 1: us mfm ua cord doppler · 50 acquisitions, 12 frames shown]
[im 2/50]
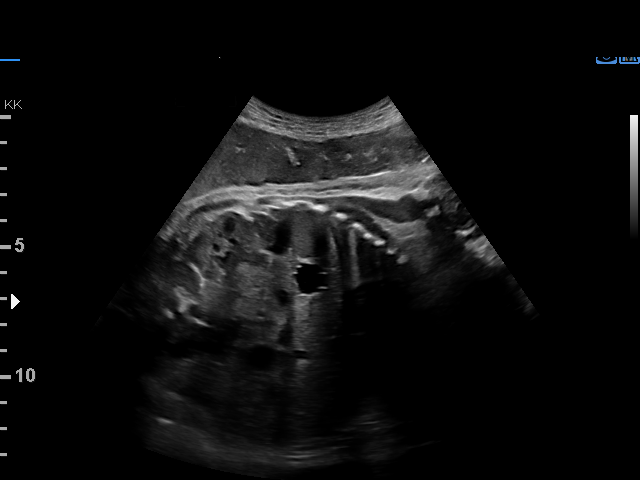
[im 6/50]
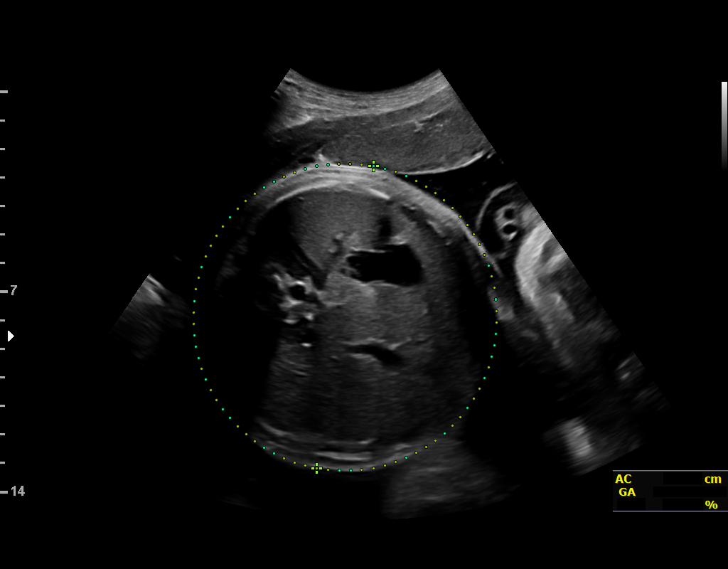
[im 10/50]
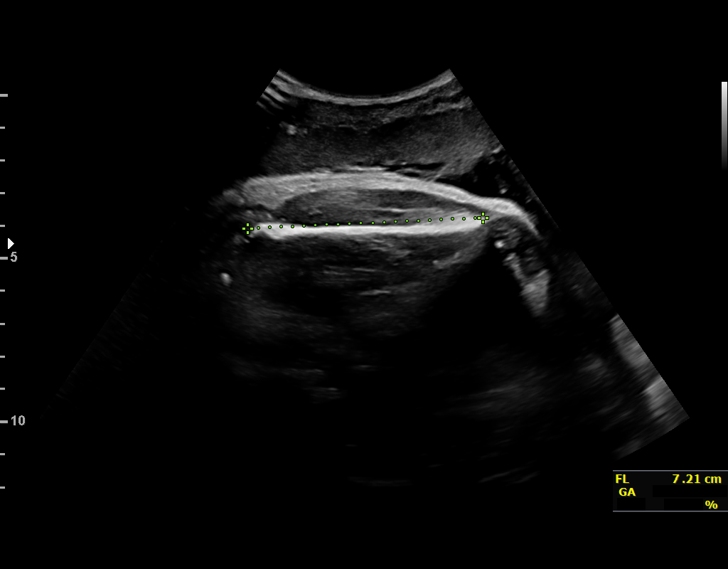
[im 15/50]
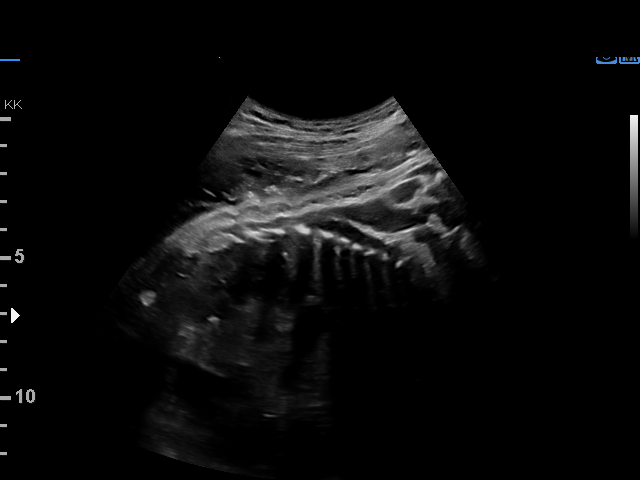
[im 19/50]
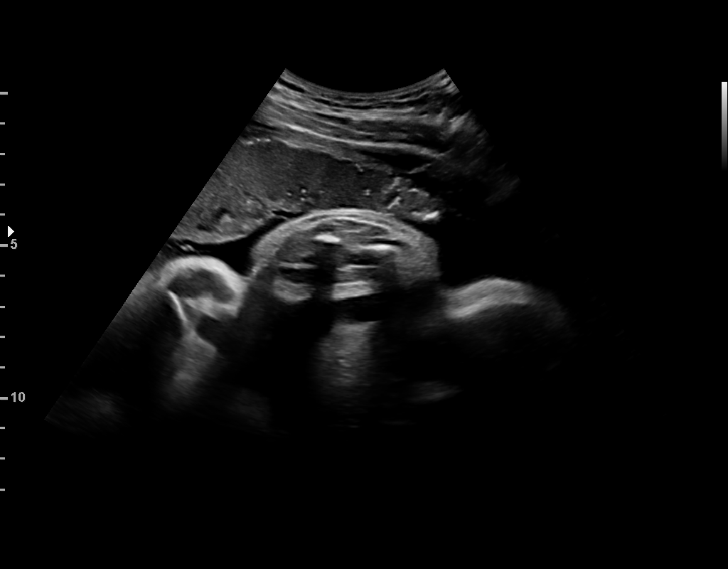
[im 22/50]
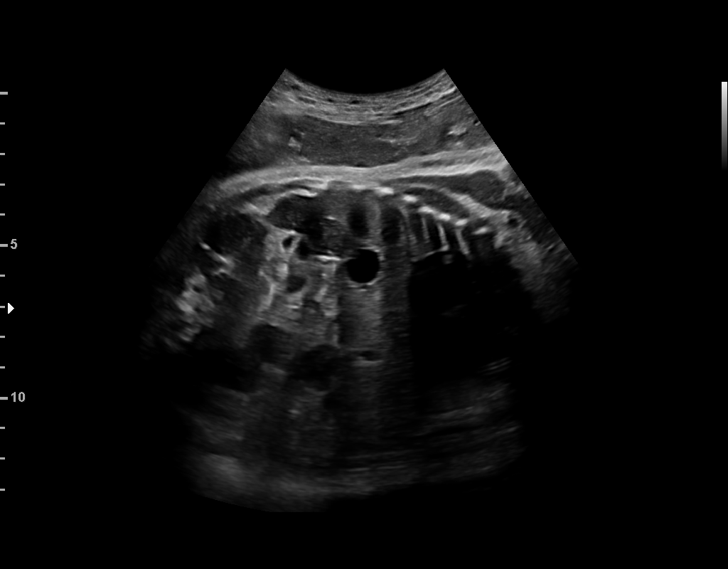
[im 28/50]
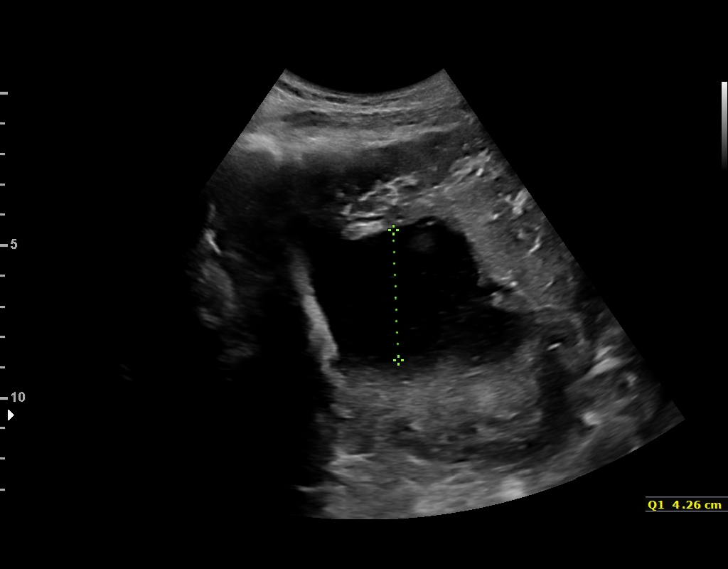
[im 31/50]
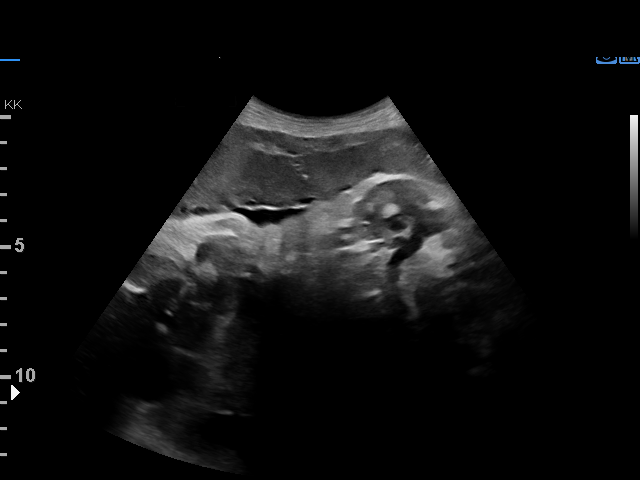
[im 35/50]
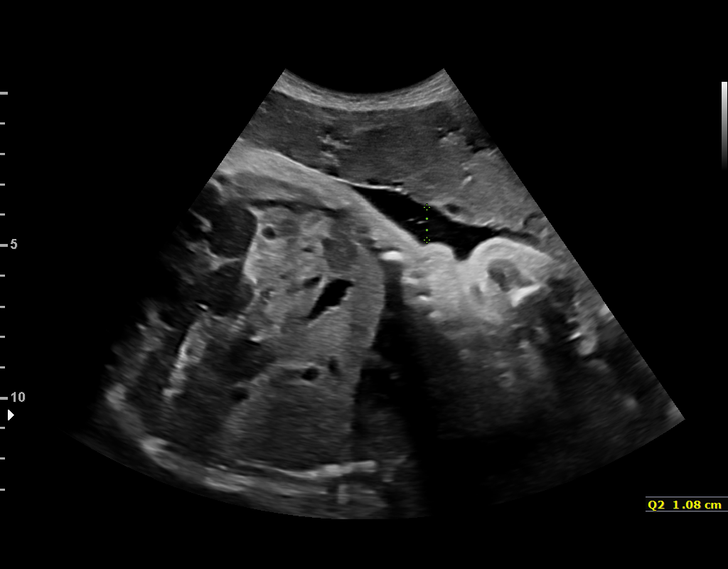
[im 40/50]
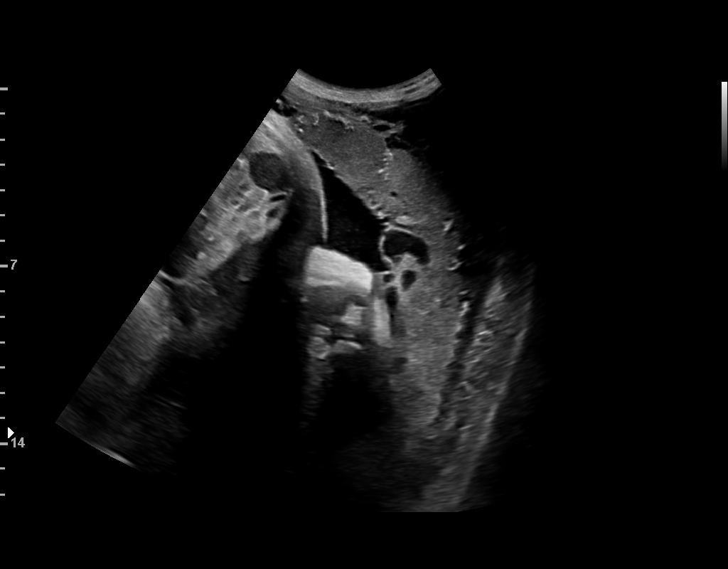
[im 44/50]
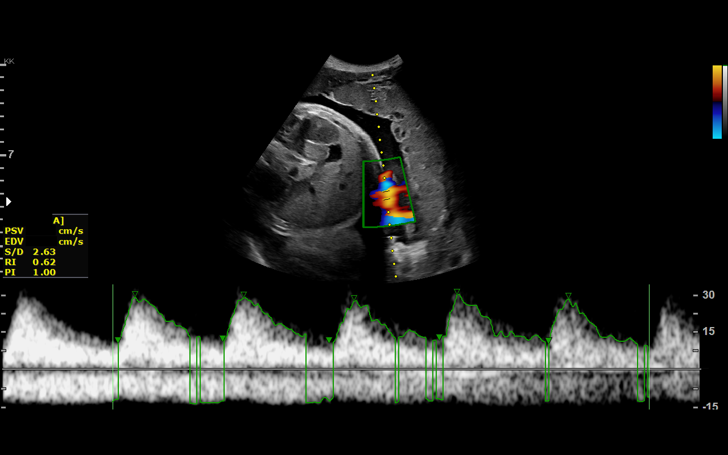
[im 48/50]
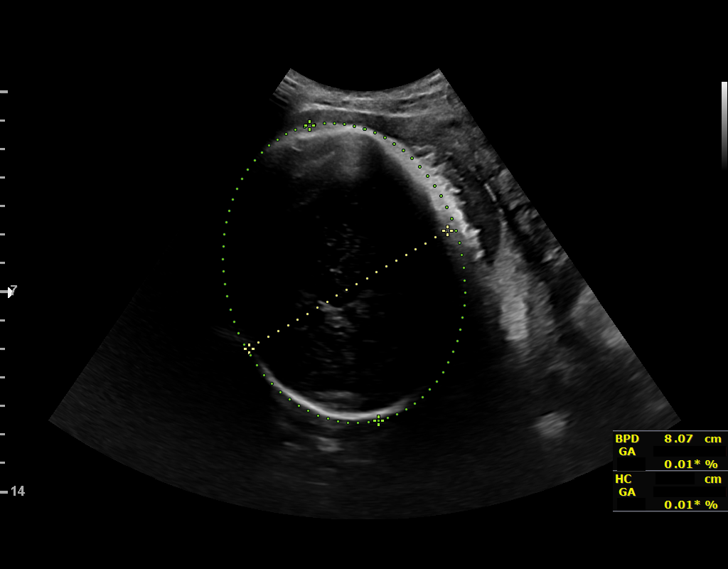

[12 of 28 positions shown; findings below may reference images not displayed]

[address]
                   [id number]

 ----------------------------------------------------------------------

 ----------------------------------------------------------------------
Indications

  Encounter for other antenatal screening
  follow-up
  Poor obstetric history: Previous neonatal
  death
  Maternal care for known or suspected poor
  fetal growth, third trimester, not applicable or
  unspecified IUGR
  Previous cesarean delivery, antepartum
  Marijuana Abuse
  38 weeks gestation of pregnancy
 ----------------------------------------------------------------------
Vital Signs

                                                Height:        5'3"
Fetal Evaluation

 Num Of Fetuses:         1
 Fetal Heart Rate(bpm):  120
 Cardiac Activity:       Observed
 Presentation:           Cephalic
 Placenta:               Anterior
 P. Cord Insertion:      Previously Visualized
 Amniotic Fluid
 AFI FV:      Within normal limits

 AFI Sum(cm)     %Tile       Largest Pocket(cm)
 8.22            13

 RUQ(cm)       RLQ(cm)       LUQ(cm)        LLQ(cm)
 4.04          1.22          0.96           2
Biophysical Evaluation

 Amniotic F.V:   Pocket => 2 cm             F. Tone:        Observed
 F. Movement:    Observed                   Score:          [DATE]
 F. Breathing:   Observed
Biometry

 BPD:      81.5  mm     G. Age:  32w 5d        < 1  %    CI:        74.07   %    70 - 86
                                                         FL/HC:      24.5   %    20.6 -
 HC:      300.7  mm     G. Age:  33w 2d        < 1  %    HC/AC:      0.88        0.87 -
 AC:      342.7  mm     G. Age:  38w 1d         52  %    FL/BPD:     90.6   %    71 - 87
 FL:       73.8  mm     G. Age:  37w 5d         29  %    FL/AC:      21.5   %    20 - 24

 Est. FW:    4644  gm    6 lb 11 oz      20  %
OB History

 Gravidity:    3         Term:   2
 Living:       2
Gestational Age

 U/S Today:     35w 3d                                        EDD:   11/28/19
 Best:          38w 6d     Det. By:  Previous Ultrasound      EDD:   11/04/19
                                     (03/13/19)
Anatomy

 Cranium:               Appears normal         Aortic Arch:            Previously seen
 Cavum:                 Previously seen        Ductal Arch:            Previously seen
 Ventricles:            Previously seen        Diaphragm:              Previously seen
 Choroid Plexus:        Previously seen        Stomach:                Appears normal, left
                                                                       sided
 Cerebellum:            Previously seen        Abdomen:                Appears normal
 Posterior Fossa:       Previously seen        Abdominal Wall:         Previously seen
 Nuchal Fold:           Previously seen        Cord Vessels:           Previously seen
 Face:                  Orbits and profile     Kidneys:                Appear normal
                        previously seen
 Lips:                  Previously seen        Bladder:                Appears normal
 Thoracic:              Appears normal         Spine:                  Previously seen
 Heart:                 Echogenic focus        Upper Extremities:      Previously seen
                        in LV prev seen
 RVOT:                  Previously seen        Lower Extremities:      Previously seen
 LVOT:                  Previously seen

 Other:  Female gender previously seen. Nasal bone previously visualized.
Doppler - Fetal Vessels

 Umbilical Artery
  S/D     %tile     RI              PI                     ADFV    RDFV
 2.71       78   0.63             1.04                        No      No

Impression

 Patient with fetal growth restriction returns for ultrasound
 assessment.  On previous growth scan the estimated fetal
 weight was at the 7th percentile.

 On today's ultrasound, amniotic fluid is normal and good fetal
 activity seen.  The estimated fetal weight is at the 20th
 percentile.  Head circumference measurement is between -2
 and -3 SD.  Antenatal testing is reassuring. BPP [DATE].
 Umbilical artery Doppler showed normal forward diastolic
 flow.
 We reassured the patient of the findings.

 Patient will be undergoing repeat cesarean delivery on
 10/29/2019.
                 Ud, Yosama

## 2020-11-05 ENCOUNTER — Ambulatory Visit: Payer: Medicaid Other

## 2020-11-05 ENCOUNTER — Ambulatory Visit: Payer: Self-pay
